# Patient Record
Sex: Male | Born: 1955 | Race: White | Hispanic: No | Marital: Married | Smoking: Never smoker
Health system: Southern US, Community
[De-identification: ages and names within clinical notes are randomized; demographics above are authoritative.]

## PROBLEM LIST (undated history)

## (undated) DIAGNOSIS — K429 Umbilical hernia without obstruction or gangrene: Secondary | ICD-10-CM

## (undated) DIAGNOSIS — J189 Pneumonia, unspecified organism: Secondary | ICD-10-CM

## (undated) DIAGNOSIS — N400 Enlarged prostate without lower urinary tract symptoms: Secondary | ICD-10-CM

## (undated) DIAGNOSIS — C801 Malignant (primary) neoplasm, unspecified: Secondary | ICD-10-CM

## (undated) DIAGNOSIS — E781 Pure hyperglyceridemia: Secondary | ICD-10-CM

## (undated) DIAGNOSIS — J40 Bronchitis, not specified as acute or chronic: Secondary | ICD-10-CM

## (undated) DIAGNOSIS — I10 Essential (primary) hypertension: Secondary | ICD-10-CM

## (undated) HISTORY — DX: Umbilical hernia without obstruction or gangrene: K42.9

## (undated) HISTORY — DX: Essential (primary) hypertension: I10

---

## 2011-05-24 ENCOUNTER — Ambulatory Visit: Payer: Self-pay

## 2011-06-11 ENCOUNTER — Ambulatory Visit: Payer: Self-pay

## 2012-04-09 HISTORY — PX: HERNIA REPAIR: SHX51

## 2012-11-10 ENCOUNTER — Encounter: Payer: Self-pay | Admitting: *Deleted

## 2012-11-27 ENCOUNTER — Ambulatory Visit (INDEPENDENT_AMBULATORY_CARE_PROVIDER_SITE_OTHER): Payer: 59 | Admitting: General Surgery

## 2012-11-27 ENCOUNTER — Encounter: Payer: Self-pay | Admitting: General Surgery

## 2012-11-27 VITALS — BP 130/72 | HR 74 | Resp 12 | Ht 74.0 in | Wt 225.0 lb

## 2012-11-27 DIAGNOSIS — K429 Umbilical hernia without obstruction or gangrene: Secondary | ICD-10-CM

## 2012-11-27 NOTE — Progress Notes (Signed)
Patient ID: Bruce Hester, male   DOB: [birth year], 57 y.o.   MRN: [id number]  Chief Complaint  Patient presents with  . Other    umbilical hernia    HPI [name] is a 57 y.o. male here today for an evaluation of an umbilical hernia. Patient noticed that the hernia had been there for years but one month ago he was doing some lifting (wood) and that caused it to hurt and enlarge. Denies any nausea, vomiting or constipation. HPI  Past Medical History  Diagnosis Date  . Hypertension     History reviewed. No pertinent past surgical history.  History reviewed. No pertinent family history.  Social History History  Substance Use Topics  . Smoking status: Never Smoker   . Smokeless tobacco: Never Used  . Alcohol Use: No    Allergies  Allergen Reactions  . Ibuprofen Rash    Current Outpatient Prescriptions  Medication Sig Dispense Refill  . metoprolol succinate (TOPROL-XL) 25 MG 24 hr tablet Take 0.5 tablets by mouth daily.        No current facility-administered medications for this visit.    Review of Systems Review of Systems  Constitutional: Negative.   Respiratory: Negative.   Cardiovascular: Negative.   Gastrointestinal: Negative.     Blood pressure 130/72, pulse 74, resp. rate 12, height 6\' 2"  (1.88 m), weight 225 lb (102.059 kg).  Physical Exam Physical Exam  Constitutional: He is oriented to person, place, and time. He appears well-developed and well-nourished.  Cardiovascular: Normal rate and regular rhythm.   Pulmonary/Chest: Effort normal and breath sounds normal.  Abdominal: Soft. Bowel sounds are normal. There is no tenderness.  Lymphadenopathy:    He has no cervical adenopathy.  Neurological: He is alert and oriented to person, place, and time.  Skin: Skin is warm and dry.  2 cm defect at umbilicus area   Data Reviewed None.  Assessment    Symptomatic umbilical hernia.     Plan    Indications for elective repair were reviewed. The  possibility of prosthetic mesh was discussed.     Patient's surgery has been scheduled for 12-04-12 at [facility].   Earline Mayotte 11/28/2012, 7:45 PM

## 2012-11-27 NOTE — Patient Instructions (Addendum)
Outpatient surgery Umbilical Hernia precautions and incarceration were discussed with the patient. If they develop symptoms of an incarcerated hernia, they were encouraged to seek prompt medical attention.  I have recommended repair of the hernia using mesh on an outpatient basis in the near future. The risk of infection was reviewed. The role of prosthetic mesh to minimize the risk of recurrence was reviewed.  Hernia, Surgical Repair A hernia occurs when an internal organ pushes out through a weak spot in the belly (abdominal) wall muscles. Hernias commonly occur in the groin and around the navel. Hernias often can be pushed back into place (reduced). Most hernias tend to get worse over time. Problems occur when abdominal contents get stuck in the opening (incarcerated hernia). The blood supply gets cut off (strangulated hernia). This is an emergency and needs surgery. Otherwise, hernia repair can be an elective procedure. This means you can schedule this at your convenience when an emergency is not present. Because complications can occur, if you decide to repair the hernia, it is best to do it soon. When it becomes an emergency procedure, there is increased risk of complications after surgery. CAUSES   Heavy lifting.  Obesity.  Prolonged coughing.  Straining to move your bowels.  Hernias can also occur through a cut (incision) by a surgeonafter an abdominal operation. HOME CARE INSTRUCTIONS Before the repair:  Bed rest is not required. You may continue your normal activities, but avoid heavy lifting (more than 10 pounds) or straining. Cough gently. If you are a smoker, it is best to stop. Even the best hernia repair can break down with the continual strain of coughing.  Do not wear anything tight over your hernia. Do not try to keep it in with an outside bandage or truss. These can damage abdominal contents if they are trapped in the hernia sac.  Eat a normal diet. Avoid constipation.  Straining over long periods of time to have a bowel movement will increase hernia size. It also can breakdown repairs. If you cannot do this with diet alone, laxatives or stool softeners may be used. PRIOR TO SURGERY, SEEK IMMEDIATE MEDICAL CARE IF: You have problems (symptoms) of a trapped (incarcerated) hernia. Symptoms include:  An oral temperature above 102 F (38.9 C) develops, or as your caregiver suggests.  Increasing abdominal pain.  Feeling sick to your stomach(nausea) and vomiting.  You stop passing gas or stool.  The hernia is stuck outside the abdomen, looks discolored, feels hard, or is tender.  You have any changes in your bowel habits or in the hernia that is unusual for you. LET YOUR CAREGIVERS KNOW ABOUT THE FOLLOWING:  Allergies.  Medications taken including herbs, eye drops, over the counter medications, and creams.  Use of steroids (by mouth or creams).  Family or personal history of problems with anesthetics or Novocaine.  Possibility of pregnancy, if this applies.  Personal history of blood clots (thrombophlebitis).  Family or personal history of bleeding or blood problems.  Previous surgery.  Other health problems. BEFORE THE PROCEDURE You should be present 1 hour prior to your procedure, or as directed by your caregiver.  AFTER THE PROCEDURE After surgery, you will be taken to the recovery area. A nurse will watch and check your progress there. Once you are awake, stable, and taking fluids well, you will be allowed to go home as long as there are no problems. Once home, an ice pack (wrapped in a light towel) applied to your operative site may help  with discomfort. It may also keep the swelling down. Do not lift anything heavier than 10 pounds (4.55 kilograms). Take showers not baths. Do not drive while taking narcotics. Follow instructions as suggested by your caregiver.  SEEK IMMEDIATE MEDICAL CARE IF: After surgery:  There is redness, swelling, or  increasing pain in the wound.  There is pus coming from the wound.  There is drainage from a wound lasting longer than 1 day.  An unexplained oral temperature above 102 F (38.9 C) develops.  You notice a foul smell coming from the wound or dressing.  There is a breaking open of a wound (edged not staying together) after the sutures have been removed.  You notice increasing pain in the shoulders (shoulder strap areas).  You develop dizzy episodes or fainting while standing.  You develop persistent nausea or vomiting.  You develop a rash.  You have difficulty breathing.  You develop any reaction or side effects to medications given. MAKE SURE YOU:   Understand these instructions.  Will watch your condition.  Will get help right away if you are not doing well or get worse. Document Released: 09/19/2000 Document Revised: 06/18/2011 Document Reviewed: 08/12/2007 [facility] Patient Information 2014 [place], [place].  Patient's surgery has been scheduled for 12-04-12 at [facility].

## 2012-11-28 ENCOUNTER — Encounter: Payer: Self-pay | Admitting: General Surgery

## 2012-11-28 ENCOUNTER — Other Ambulatory Visit: Payer: Self-pay | Admitting: General Surgery

## 2012-11-28 DIAGNOSIS — K429 Umbilical hernia without obstruction or gangrene: Secondary | ICD-10-CM

## 2012-12-02 ENCOUNTER — Ambulatory Visit: Payer: Self-pay | Admitting: Anesthesiology

## 2012-12-02 DIAGNOSIS — I10 Essential (primary) hypertension: Secondary | ICD-10-CM

## 2012-12-04 ENCOUNTER — Ambulatory Visit: Payer: Self-pay | Admitting: General Surgery

## 2012-12-04 DIAGNOSIS — K429 Umbilical hernia without obstruction or gangrene: Secondary | ICD-10-CM

## 2012-12-05 ENCOUNTER — Encounter: Payer: Self-pay | Admitting: General Surgery

## 2012-12-15 ENCOUNTER — Ambulatory Visit (INDEPENDENT_AMBULATORY_CARE_PROVIDER_SITE_OTHER): Payer: 59 | Admitting: General Surgery

## 2012-12-15 ENCOUNTER — Encounter: Payer: Self-pay | Admitting: General Surgery

## 2012-12-15 VITALS — BP 138/82 | HR 80 | Resp 14 | Ht 74.0 in | Wt 223.0 lb

## 2012-12-15 DIAGNOSIS — K429 Umbilical hernia without obstruction or gangrene: Secondary | ICD-10-CM

## 2012-12-15 NOTE — Patient Instructions (Addendum)
Patient to return as needed. 

## 2012-12-15 NOTE — Progress Notes (Signed)
Patient ID: Bruce Hester, male   DOB: 1955-07-27, 57 y.o.   MRN: [id number]  Chief Complaint  Patient presents with  . Routine Post Op    umbilical hernia     HPI Bruce Hester is a 57 y.o. male here today for his post op umbilical hernia repair done  The procedure was performed on 12/04/12. Patient states he is doing well. No complaints at this time.   HPI  Past Medical History  Diagnosis Date  . Hypertension     Past Surgical History  Procedure Laterality Date  . Hernia repair  2014    umbilical    No family history on file.  Social History History  Substance Use Topics  . Smoking status: Never Smoker   . Smokeless tobacco: Never Used  . Alcohol Use: No    Allergies  Allergen Reactions  . Ibuprofen Rash    Current Outpatient Prescriptions  Medication Sig Dispense Refill  . metoprolol succinate (TOPROL-XL) 25 MG 24 hr tablet Take 0.5 tablets by mouth daily.        No current facility-administered medications for this visit.    Review of Systems Review of Systems  Constitutional: Negative.   Respiratory: Negative.   Cardiovascular: Negative.   Gastrointestinal: Negative.     Blood pressure 138/82, pulse 80, resp. rate 14, height 6\' 2"  (1.88 m), weight 223 lb (101.152 kg).  Physical Exam Physical Exam  Constitutional: He is oriented to person, place, and time. He appears well-developed and well-nourished.  Abdominal: Soft. Normal appearance and bowel sounds are normal. There is no hepatosplenomegaly. There is no tenderness. No hernia.  Neurological: He is alert and oriented to person, place, and time.  Skin: Skin is warm and dry.    Data Reviewed None.  Assessment    Doing well status post umbilical hernia repair.    Plan    Care lifting was reviewed. Will have him return to work with a 20 pound weight limit for the next 2 weeks.       Earline Mayotte 12/15/2012, 9:12 PM

## 2013-01-07 ENCOUNTER — Telehealth: Payer: Self-pay

## 2013-01-07 NOTE — Telephone Encounter (Signed)
Work note sent at patient request.

## 2013-04-09 DIAGNOSIS — J189 Pneumonia, unspecified organism: Secondary | ICD-10-CM

## 2013-04-09 HISTORY — DX: Pneumonia, unspecified organism: J18.9

## 2013-04-09 HISTORY — PX: COLONOSCOPY: SHX174

## 2013-05-27 ENCOUNTER — Ambulatory Visit: Payer: Self-pay | Admitting: Family Medicine

## 2013-10-08 DIAGNOSIS — E781 Pure hyperglyceridemia: Secondary | ICD-10-CM | POA: Insufficient documentation

## 2013-12-10 DIAGNOSIS — R739 Hyperglycemia, unspecified: Secondary | ICD-10-CM | POA: Insufficient documentation

## 2014-01-18 ENCOUNTER — Ambulatory Visit: Payer: Self-pay | Admitting: Gastroenterology

## 2014-01-20 LAB — PATHOLOGY REPORT

## 2014-03-21 ENCOUNTER — Ambulatory Visit: Payer: Self-pay

## 2014-07-30 NOTE — Op Note (Signed)
PATIENT NAME:  JOHNMARK, GEIGER MR#:  [id number] DATE OF BIRTH:  11-Oct-1955  DATE OF PROCEDURE:  12/04/2012  PREOPERATIVE DIAGNOSIS:  Umbilical hernia.   POSTOPERATIVE DIAGNOSIS:  Umbilical hernia.  OPERATIVE PROCEDURE:  Umbilical hernia repair.   SURGEON:  Hervey Ard, M.D.   ANESTHESIA:  General by LMA, Marcaine 0.5% plain, 30 mL local infiltration.   ESTIMATED BLOOD LOSS:  Minimal.   CLINICAL NOTE:  This 59 year old male has developed a symptomatic umbilical hernia.  He was admitted for elective repair.  He received Kefzol prior to the procedure.  Hair was removed with clippers.   OPERATIVE NOTE:  With the patient comfortably supine on the operating table, the area was prepped with ChloraPrep and draped.  Marcaine was infiltrated for postoperative analgesia.  An infraumbilical incision was made sharply and carried down through the skin and subcutaneous tissue with hemostasis achieved with electrocautery.  The umbilical skin was elevated off the umbilical hernia sac which was excised and discarded.  The inferior surface of the fascia was clear.  No additional defects were noted.  The fascial defect was noted to be 2 cm in diameter.  A primary repair was undertaken.  Interrupted 0 Surgilon sutures were used with easy approximation of the fascia.  The umbilical skin was tacked to the fascia with 3-0 Vicryl suture.  The adipose layer was approximated with a running 3-0 Vicryl.  The skin was closed with a running 4-0 Vicryl subcuticular suture.  Benzoin, Steri-Strips, Telfa and Tegaderm dressing was then applied.    The patient tolerated the procedure well and was taken to the recovery room in stable condition.      ____________________________ Robert Bellow, MD jwb:ea D: 12/04/2012 18:23:28 ET T: 12/05/2012 03:17:10 ET JOB#: [id number]  cc: Robert Bellow, MD, <Dictator> Leonie Douglas. Doy Hutching, MD Robert Bellow, MD Truitt Cruey Amedeo Kinsman MD ELECTRONICALLY SIGNED 12/05/2012 22:25

## 2015-02-01 ENCOUNTER — Encounter: Payer: Self-pay | Admitting: Emergency Medicine

## 2015-02-01 ENCOUNTER — Ambulatory Visit
Admission: EM | Admit: 2015-02-01 | Discharge: 2015-02-01 | Disposition: A | Discharge status: Home / Self Care | Payer: 59 | Attending: Family Medicine | Admitting: Family Medicine

## 2015-02-01 DIAGNOSIS — J40 Bronchitis, not specified as acute or chronic: Secondary | ICD-10-CM

## 2015-02-01 DIAGNOSIS — R05 Cough: Secondary | ICD-10-CM

## 2015-02-01 DIAGNOSIS — R059 Cough, unspecified: Secondary | ICD-10-CM

## 2015-02-01 MED ORDER — ALBUTEROL SULFATE HFA 108 (90 BASE) MCG/ACT IN AERS: 2.0000 | INHALATION_SPRAY | RESPIRATORY_TRACT | Status: DC | PRN

## 2015-02-01 MED ORDER — HYDROCOD POLST-CPM POLST ER 10-8 MG/5ML PO SUER: 5.0000 mL | Freq: Two times a day (BID) | ORAL | Status: DC | PRN

## 2015-02-01 MED ORDER — AZITHROMYCIN 250 MG PO TABS: ORAL_TABLET | ORAL | Status: DC

## 2015-02-01 NOTE — ED Provider Notes (Signed)
CSN: [id number]     Arrival date & time 02/01/15  1315 History   First MD Initiated Contact with Patient 02/01/15 1349     Nurses notes were reviewed. Chief Complaint  Patient presents with  . Cough   He has been having a cough for a week and nonproductive  (Consider location/radiation/quality/duration/timing/severity/associated sxs/prior Treatment) Patient is a 59 y.o. male presenting with cough. The history is provided by the patient. No language interpreter was used.  Cough Cough characteristics:  Non-productive Severity:  Moderate Associated symptoms: shortness of breath and wheezing   Associated symptoms: no chest pain, no ear pain and no rash     Past Medical History  Diagnosis Date  . Hypertension    Past Surgical History  Procedure Laterality Date  . Hernia repair  1537    umbilical   History reviewed. No pertinent family history. Social History  Substance Use Topics  . Smoking status: Never Smoker   . Smokeless tobacco: Never Used  . Alcohol Use: No    Review of Systems  Constitutional: Negative for activity change and appetite change.  HENT: Negative for ear pain.   Respiratory: Positive for cough, shortness of breath and wheezing.   Cardiovascular: Negative for chest pain, palpitations and leg swelling.  Musculoskeletal: Negative for joint swelling.  Skin: Negative for color change and rash.  All other systems reviewed and are negative.   Allergies  Ibuprofen  Home Medications   Prior to Admission medications   Medication Sig Start Date End Date Taking? Authorizing Provider  albuterol (PROVENTIL HFA;VENTOLIN HFA) 108 (90 BASE) MCG/ACT inhaler Inhale 2 puffs into the lungs every 4 (four) hours as needed for wheezing or shortness of breath. 02/01/15   Frederich Cha, MD  azithromycin (ZITHROMAX Z-PAK) 250 MG tablet Take 2 tablets first day and then 1 po a day for 4 days 02/01/15   Frederich Cha, MD  chlorpheniramine-HYDROcodone [facility])  10-8 MG/5ML SUER Take 5 mLs by mouth every 12 (twelve) hours as needed for cough. 02/01/15   Frederich Cha, MD  metoprolol succinate (TOPROL-XL) 25 MG 24 hr tablet Take 0.5 tablets by mouth daily.  11/20/12   Historical Provider, MD   Meds Ordered and Administered this Visit  Medications - No data to display  BP 156/90 mmHg  Pulse 100  Temp(Src) 98.8 F (37.1 C) (Tympanic)  Resp 16  Ht 6\' 2"  (1.88 m)  Wt 235 lb (106.595 kg)  BMI 30.16 kg/m2  SpO2 96% No data found.   Physical Exam  Constitutional: He is oriented to person, place, and time. He appears well-developed and well-nourished.  HENT:  Head: Normocephalic and atraumatic.  Right Ear: External ear normal.  Left Ear: External ear normal.  Nose: Nose normal.  Eyes: Pupils are equal, round, and reactive to light.  Neck: Normal range of motion. Neck supple. No tracheal deviation present.  Cardiovascular: Normal rate, regular rhythm and normal heart sounds.   Pulmonary/Chest: Effort normal and breath sounds normal. No respiratory distress.  Musculoskeletal: Normal range of motion. He exhibits no edema.  Neurological: He is alert and oriented to person, place, and time. No cranial nerve deficit.  Skin: Skin is warm and dry. He is not diaphoretic.  Psychiatric: He has a normal mood and affect. His behavior is normal.  Vitals reviewed.   ED Course  Procedures (including critical care time)  Labs Review Labs Reviewed - No data to display  Imaging Review No results found.   Visual Acuity Review  Right Eye Distance:   Left Eye Distance:   Bilateral Distance:    Right Eye Near:   Left Eye Near:    Bilateral Near:         MDM   1. Bronchitis   2. Cough    patient's here because of cough for week will place on Z-Pak and Tussionex and albuterol inhaler.  Frederich Cha, MD 02/01/15 1500

## 2015-02-01 NOTE — Discharge Instructions (Signed)
Upper Respiratory Infection, Adult Most upper respiratory infections (URIs) are caused by a virus. A URI affects the nose, throat, and upper air passages. The most common type of URI is often called "the common cold." HOME CARE   Take medicines only as told by your doctor.  Gargle warm saltwater or take cough drops to comfort your throat as told by your doctor.  Use a warm mist humidifier or inhale steam from a shower to increase air moisture. This may make it easier to breathe.  Drink enough fluid to keep your pee (urine) clear or pale yellow.  Eat soups and other clear broths.  Have a healthy diet.  Rest as needed.  Go back to work when your fever is gone or your doctor says it is okay.  You may need to stay home longer to avoid giving your URI to others.  You can also wear a face mask and wash your hands often to prevent spread of the virus.  Use your inhaler more if you have asthma.  Do not use any tobacco products, including cigarettes, chewing tobacco, or electronic cigarettes. If you need help quitting, ask your doctor. GET HELP IF:  You are getting worse, not better.  Your symptoms are not helped by medicine.  You have chills.  You are getting more short of breath.  You have brown or red mucus.  You have yellow or brown discharge from your nose.  You have pain in your face, especially when you bend forward.  You have a fever.  You have puffy (swollen) neck glands.  You have pain while swallowing.  You have white areas in the back of your throat. GET HELP RIGHT AWAY IF:   You have very bad or constant:  Headache.  Ear pain.  Pain in your forehead, behind your eyes, and over your cheekbones (sinus pain).  Chest pain.  You have long-lasting (chronic) lung disease and any of the following:  Wheezing.  Long-lasting cough.  Coughing up blood.  A change in your usual mucus.  You have a stiff neck.  You have changes in  your:  Vision.  Hearing.  Thinking.  Mood. MAKE SURE YOU:   Understand these instructions.  Will watch your condition.  Will get help right away if you are not doing well or get worse.   This information is not intended to replace advice given to you by your health care provider. Make sure you discuss any questions you have with your health care provider.   Document Released: 09/12/2007 Document Revised: 08/10/2014 Document Reviewed: 07/01/2013 Elsevier Interactive Patient Education 2016 [employer]  Cough, Adult A cough helps to clear your throat and lungs. A cough may last only 2-3 weeks (acute), or it may last longer than 8 weeks (chronic). Many different things can cause a cough. A cough may be a sign of an illness or another medical condition. HOME CARE  Pay attention to any changes in your cough.  Take medicines only as told by your doctor.  If you were prescribed an antibiotic medicine, take it as told by your doctor. Do not stop taking it even if you start to feel better.  Talk with your doctor before you try using a cough medicine.  Drink enough fluid to keep your pee (urine) clear or pale yellow.  If the air is dry, use a cold steam vaporizer or humidifier in your home.  Stay away from things that make you cough at work or at home.  If your cough is worse at night, try using extra pillows to raise your head up higher while you sleep.  Do not smoke, and try not to be around smoke. If you need help quitting, ask your doctor.  Do not have caffeine.  Do not drink alcohol.  Rest as needed. GET HELP IF:  You have new problems (symptoms).  You cough up yellow fluid (pus).  Your cough does not get better after 2-3 weeks, or your cough gets worse.  Medicine does not help your cough and you are not sleeping well.  You have pain that gets worse or pain that is not helped with medicine.  You have a fever.  You are losing weight and you do not know  why.  You have night sweats. GET HELP RIGHT AWAY IF:  You cough up blood.  You have trouble breathing.  Your heartbeat is very fast.   This information is not intended to replace advice given to you by your health care provider. Make sure you discuss any questions you have with your health care provider.   Document Released: 12/07/2010 Document Revised: 12/15/2014 Document Reviewed: 06/02/2014 Elsevier Interactive Patient Education [employer].

## 2015-02-01 NOTE — ED Notes (Signed)
Patient c/o cough and chest congestion for 7 days.  Patient denies fevers.

## 2015-03-17 ENCOUNTER — Ambulatory Visit (INDEPENDENT_AMBULATORY_CARE_PROVIDER_SITE_OTHER): Payer: BLUE CROSS/BLUE SHIELD | Admitting: Urology

## 2015-03-17 ENCOUNTER — Encounter: Payer: Self-pay | Admitting: Urology

## 2015-03-17 VITALS — BP 164/101 | HR 86 | Ht 74.0 in | Wt 234.6 lb

## 2015-03-17 DIAGNOSIS — N528 Other male erectile dysfunction: Secondary | ICD-10-CM

## 2015-03-17 DIAGNOSIS — N138 Other obstructive and reflux uropathy: Secondary | ICD-10-CM

## 2015-03-17 DIAGNOSIS — N401 Enlarged prostate with lower urinary tract symptoms: Secondary | ICD-10-CM | POA: Diagnosis not present

## 2015-03-17 DIAGNOSIS — N5089 Other specified disorders of the male genital organs: Secondary | ICD-10-CM

## 2015-03-17 DIAGNOSIS — N529 Male erectile dysfunction, unspecified: Secondary | ICD-10-CM | POA: Insufficient documentation

## 2015-03-17 NOTE — Progress Notes (Signed)
03/17/2015 10:23 AM   Bruce Hester 06-[name]-1956 [id number]  Referring provider: Idelle Crouch, MD [facility] [place], [place] [address]  Chief Complaint  Patient presents with  . Groin Swelling    referred by Dr. Doy Hutching x 1 month (swelling)    HPI: Patient is a 59 year old white male with a one month history of scrotal swelling who is referred by his PCP, Dr. Doy Hutching for further evaluation and management.     Patient states his wife, [name], noticed that his testicles had become 2 months ago.  The patient stated he had noticed. Since his wife has made that observation he has not noticed any change in the scrotum. He is not finding the swelling, comfortable. He is not had trauma or infection to the scrotal area.  He does have a history of an umbilical hernia which was repaired in 2014.    He has baseline urinary symptoms of urgency, nocturia 2, hesitancy and weak urinary stream. He states that these symptoms he can a few months ago. He has not had dysuria, gross hematuria or suprapubic pain.  He states he has his prostate examined with his primary care physician. His last PSA was 0.22 ng/mL on 12/03/2013.  He also had the onset of erectile dysfunction 2 weeks ago. He states that his erection was not firm enough for intercourse. He denied any pain or curvature with erection. He attributed to an increase in his blood pressure medication.  The dysfunction seems to have resolved for now.  He has not had any recent fevers, chills, nausea or vomiting.    PMH: Past Medical History  Diagnosis Date  . Hypertension     Surgical History: Past Surgical History  Procedure Laterality Date  . Hernia repair  123456    umbilical    Home Medications:    Medication List       This list is accurate as of: 03/17/15 10:23 AM.  Always use your most recent med list.               albuterol 108 (90 BASE) MCG/ACT inhaler  Commonly known as:  PROVENTIL  HFA;VENTOLIN HFA  Inhale 2 puffs into the lungs every 4 (four) hours as needed for wheezing or shortness of breath.     aspirin EC 81 MG tablet  Take by mouth.     azithromycin 250 MG tablet  Commonly known as:  ZITHROMAX Z-PAK  Take 2 tablets first day and then 1 po a day for 4 days     chlorpheniramine-HYDROcodone 10-8 MG/5ML Suer  Commonly known as:  TUSSIONEX PENNKINETIC ER  Take 5 mLs by mouth every 12 (twelve) hours as needed for cough.     DHA-EPA-VITAMIN E PO  Take by mouth.     FISH OIL PO  Take by mouth daily.     metoprolol succinate 25 MG 24 hr tablet  Commonly known as:  TOPROL-XL  Take 0.5 tablets by mouth daily.     Na Sulfate-K Sulfate-Mg Sulf Soln  Take as directed for colon prep.        Allergies:  Allergies  Allergen Reactions  . Ibuprofen Rash    High doses    Family History: Family History  Problem Relation Age of Onset  . Kidney disease Neg Hx   . Prostate cancer Neg Hx   . Cancer Neg Hx   . Heart disease Father     Social History:  reports that  he has never smoked. He has never used smokeless tobacco. He reports that he does not drink alcohol or use illicit drugs.  ROS: UROLOGY Frequent Urination?: No Hard to postpone urination?: Yes Burning/pain with urination?: No Get up at night to urinate?: Yes Leakage of urine?: No Urine stream starts and stops?: No Trouble starting stream?: Yes Do you have to strain to urinate?: No Blood in urine?: No Urinary tract infection?: No Sexually transmitted disease?: No Injury to kidneys or bladder?: No Painful intercourse?: No Weak stream?: Yes Erection problems?: Yes Penile pain?: No  Gastrointestinal Nausea?: No Vomiting?: No Indigestion/heartburn?: No Diarrhea?: No Constipation?: No  Constitutional Fever: No Night sweats?: No Weight loss?: No Fatigue?: No  Skin Skin rash/lesions?: No Itching?: No  Eyes Blurred vision?: No Double vision?: No  Ears/Nose/Throat Sore  throat?: No Sinus problems?: No  Hematologic/Lymphatic Swollen glands?: No Easy bruising?: No  Cardiovascular Leg swelling?: No Chest pain?: No  Respiratory Cough?: No Shortness of breath?: No  Endocrine Excessive thirst?: No  Musculoskeletal Back pain?: No Joint pain?: Yes  Neurological Headaches?: No Dizziness?: No  Psychologic Depression?: No Anxiety?: No  Physical Exam: BP 164/101 mmHg  Pulse 86  Ht 6\' 2"  (1.88 m)  Wt 234 lb 9.6 oz (106.414 kg)  BMI 30.11 kg/m2  Constitutional: Well nourished. Alert and oriented, No acute distress. HEENT: [place] AT, moist mucus membranes. Trachea midline, no masses. Cardiovascular: No clubbing, cyanosis, or edema. Respiratory: Normal respiratory effort, no increased work of breathing. GI: Abdomen is soft, non tender, non distended, no abdominal masses. Liver and spleen not palpable.  No hernias appreciated.  Stool sample for occult testing is not indicated.   GU: No CVA tenderness.  No bladder fullness or masses.  Patient with circumcised phallus.  Urethral meatus is patent.  No penile discharge. No penile lesions or rashes. Scrotum without lesions, cysts, rashes and/or edema.  Bilateral hydroceles are noted.  Testicles could not be palpated due to the hydroceles. No masses are appreciated in the testicles. Left and right epididymis are normal. Rectal: Patient with  normal sphincter tone. Anus and perineum without scarring or rashes. No rectal masses are appreciated. Prostate is approximately 50 grams, (could not palpated entire gland due to buttocks tissue). Skin: No rashes, bruises or suspicious lesions. Lymph: No cervical or inguinal adenopathy. Neurologic: Grossly intact, no focal deficits, moving all 4 extremities. Psychiatric: Normal mood and affect.   Assessment & Plan:    1. Scrotal swelling:   Patient has bilateral hydroceles, but the testicles could not be palpated.  I will obtain a scrotal ultrasound to assess the  testicles and the other contents of the scrotum.  He will return for his report and discussion concerning surgery of the hydroceles.    2. BPH with LUTS:   Patient is having mild obstructive symptoms at this time.  We will readdress once the scrotal swelling work up is complete.  - PSA  3. Erectile dysfunction:  Patient had a the recent onset of ED 2 weeks ago which he attributes to his BP medication.  It has seemed to resolved for the time being.  We will continue to monitor.    Return in about 1 week (around 03/24/2015) for scrotal ultrasound report.  Zara Council, [facility] [address], [place], [place] [address] [phone]

## 2015-03-18 ENCOUNTER — Telehealth: Payer: Self-pay

## 2015-03-18 LAB — PSA: Prostate Specific Ag, Serum: 0.2 ng/mL (ref 0.0–4.0)

## 2015-03-18 NOTE — Telephone Encounter (Signed)
-----   Message from Nori Riis, PA-C sent at 03/18/2015  8:34 AM EST ----- PSA is within normal limits.  I suggest repeated it again in one year.

## 2015-03-18 NOTE — Telephone Encounter (Signed)
LMOM

## 2015-03-21 ENCOUNTER — Ambulatory Visit
Admission: RE | Admit: 2015-03-21 | Discharge: 2015-03-21 | Disposition: A | Discharge status: Home / Self Care | Payer: BLUE CROSS/BLUE SHIELD | Source: Ambulatory Visit | Attending: Urology | Admitting: Urology

## 2015-03-21 DIAGNOSIS — N5089 Other specified disorders of the male genital organs: Secondary | ICD-10-CM | POA: Diagnosis present

## 2015-03-21 DIAGNOSIS — N433 Hydrocele, unspecified: Secondary | ICD-10-CM | POA: Diagnosis not present

## 2015-03-21 NOTE — Telephone Encounter (Signed)
No answer

## 2015-03-23 NOTE — Telephone Encounter (Signed)
LMOM- will send a letter.  

## 2015-03-24 ENCOUNTER — Encounter: Payer: Self-pay | Admitting: Urology

## 2015-03-24 ENCOUNTER — Ambulatory Visit (INDEPENDENT_AMBULATORY_CARE_PROVIDER_SITE_OTHER): Payer: BLUE CROSS/BLUE SHIELD | Admitting: Urology

## 2015-03-24 VITALS — BP 158/99 | HR 65 | Ht 76.0 in | Wt 232.4 lb

## 2015-03-24 DIAGNOSIS — N528 Other male erectile dysfunction: Secondary | ICD-10-CM | POA: Diagnosis not present

## 2015-03-24 DIAGNOSIS — N401 Enlarged prostate with lower urinary tract symptoms: Secondary | ICD-10-CM

## 2015-03-24 DIAGNOSIS — N529 Male erectile dysfunction, unspecified: Secondary | ICD-10-CM

## 2015-03-24 DIAGNOSIS — N433 Hydrocele, unspecified: Secondary | ICD-10-CM

## 2015-03-24 DIAGNOSIS — N138 Other obstructive and reflux uropathy: Secondary | ICD-10-CM

## 2015-03-24 LAB — MICROSCOPIC EXAMINATION: Bacteria, UA: NONE SEEN

## 2015-03-24 LAB — URINALYSIS, COMPLETE
Bilirubin, UA: NEGATIVE
GLUCOSE, UA: NEGATIVE
KETONES UA: NEGATIVE
Leukocytes, UA: NEGATIVE
NITRITE UA: NEGATIVE
Protein, UA: NEGATIVE
Urobilinogen, Ur: 0.2 mg/dL (ref 0.2–1.0)
pH, UA: 6 (ref 5.0–7.5)

## 2015-03-24 NOTE — Progress Notes (Addendum)
10:02 AM   Bruce Hester 08/11/1955 [id number]  Referring provider: Idelle Crouch, MD [facility] [place], [place] [address]  Chief Complaint  Patient presents with  . Results    [place] srotal    HPI: Patient is a 59 year old white male who presents for his scrotal ultrasound report.  Background story Patient states his wife, Bruce Hester, noticed that his testicles had become 2 months ago.  The patient stated he had noticed. Since his wife has made that observation he has not noticed any change in the scrotum. He is not finding the swelling, comfortable. He is not had trauma or infection to the scrotal area.  He does have a history of an umbilical hernia which was repaired in 2014.    He has baseline urinary symptoms of urgency, nocturia 2, hesitancy and weak urinary stream. He states that these symptoms he can a few months ago. He has not had dysuria, gross hematuria or suprapubic pain.  He states he has his prostate examined with his primary care physician. His last PSA was 0.2 ng/mL on 03/17/2015.  He also had the onset of erectile dysfunction 2 weeks ago. He states that his erection was not firm enough for intercourse. He denied any pain or curvature with erection. He attributed to an increase in his blood pressure medication.  The dysfunction seems to have resolved for now.  Today, he states he has not had any changes with his scrotum.  He has not had any recent fevers, chills, nausea or vomiting.  I have reviewed the images with the patient and his wife.    PMH: Past Medical History  Diagnosis Date  . Hypertension   . Umbilical hernia     Surgical History: Past Surgical History  Procedure Laterality Date  . Hernia repair  123456    umbilical    Home Medications:    Medication List       This list is accurate as of: 03/24/15 10:02 AM.  Always use your most recent med list.               albuterol 108 (90 BASE) MCG/ACT inhaler    Commonly known as:  PROVENTIL HFA;VENTOLIN HFA  Inhale 2 puffs into the lungs every 4 (four) hours as needed for wheezing or shortness of breath.     aspirin EC 81 MG tablet  Take by mouth.     azithromycin 250 MG tablet  Commonly known as:  ZITHROMAX Z-PAK  Take 2 tablets first day and then 1 po a day for 4 days     chlorpheniramine-HYDROcodone 10-8 MG/5ML Suer  Commonly known as:  TUSSIONEX PENNKINETIC ER  Take 5 mLs by mouth every 12 (twelve) hours as needed for cough.     DHA-EPA-VITAMIN E PO  Take by mouth.     FISH OIL PO  Take by mouth daily.     metoprolol succinate 25 MG 24 hr tablet  Commonly known as:  TOPROL-XL  Take 0.5 tablets by mouth daily.     Na Sulfate-K Sulfate-Mg Sulf Soln  Reported on 03/24/2015        Allergies:  Allergies  Allergen Reactions  . Ibuprofen Rash    High doses    Family History: Family History  Problem Relation Age of Onset  . Kidney disease Neg Hx   . Prostate cancer Neg Hx   . Cancer Neg Hx   . Heart disease Father  Social History:  reports that he has never smoked. He has never used smokeless tobacco. He reports that he does not drink alcohol or use illicit drugs.  ROS: UROLOGY Frequent Urination?: No Hard to postpone urination?: Yes Burning/pain with urination?: No Get up at night to urinate?: No Leakage of urine?: No Urine stream starts and stops?: No Trouble starting stream?: No Do you have to strain to urinate?: No Blood in urine?: No Urinary tract infection?: No Sexually transmitted disease?: No Injury to kidneys or bladder?: No Painful intercourse?: No Weak stream?: Yes Erection problems?: No Penile pain?: No  Gastrointestinal Nausea?: No Vomiting?: No Indigestion/heartburn?: No Diarrhea?: No Constipation?: No  Constitutional Fever: No Night sweats?: No Weight loss?: No Fatigue?: No  Skin Skin rash/lesions?: No Itching?: No  Eyes Blurred vision?: Yes Double vision?:  No  Ears/Nose/Throat Sore throat?: No Sinus problems?: No  Hematologic/Lymphatic Swollen glands?: No Easy bruising?: No  Cardiovascular Leg swelling?: No Chest pain?: No  Respiratory Cough?: No Shortness of breath?: No  Endocrine Excessive thirst?: No  Musculoskeletal Back pain?: No Joint pain?: Yes  Neurological Headaches?: No Dizziness?: No  Psychologic Depression?: No Anxiety?: No  Physical Exam: BP 158/99 mmHg  Pulse 65  Ht 6\' 4"  (1.93 m)  Wt 232 lb 6.4 oz (105.416 kg)  BMI 28.30 kg/m2  Constitutional: Well nourished. Alert and oriented, No acute distress. HEENT: [place] AT, moist mucus membranes. Trachea midline, no masses. Cardiovascular: No clubbing, cyanosis, or edema. Respiratory: Normal respiratory effort, no increased work of breathing. GI: Abdomen is soft, non tender, non distended, no abdominal masses. Liver and spleen not palpable.  No hernias appreciated.  Stool sample for occult testing is not indicated.   GU: No CVA tenderness.  No bladder fullness or masses.  Patient with circumcised phallus.  Urethral meatus is patent.  No penile discharge. No penile lesions or rashes. Scrotum without lesions, cysts, rashes and/or edema.  Bilateral hydroceles are noted.  Testicles could not be palpated due to the hydroceles. No masses are appreciated in the testicles. Left and right epididymis are normal. Rectal: Patient with  normal sphincter tone. Anus and perineum without scarring or rashes. No rectal masses are appreciated. Prostate is approximately 50 grams, (could not palpated entire gland due to buttocks tissue). Skin: No rashes, bruises or suspicious lesions. Lymph: No cervical or inguinal adenopathy. Neurologic: Grossly intact, no focal deficits, moving all 4 extremities. Psychiatric: Normal mood and affect.  Pertinent Imaging CLINICAL DATA: Bilateral scrotal swelling for 2 months  EXAM: SCROTAL ULTRASOUND  DOPPLER ULTRASOUND OF THE  TESTICLES  TECHNIQUE: Complete ultrasound examination of the testicles, epididymis, and other scrotal structures was performed. Color and spectral Doppler ultrasound were also utilized to evaluate blood flow to the testicles.  COMPARISON: None.  FINDINGS: Right testicle  Measurements: 4.4 x 2.6 x 3.5 cm. No mass or microlithiasis visualized.  Left testicle  Measurements: 4.6 x 2.2 x 3.1 cm. No mass or microlithiasis visualized.  Right epididymis: Normal in size and appearance.  Left epididymis: Normal in size and appearance.  Hydrocele: Moderate-sized right hydrocele.  Varicocele: None visualized.  Pulsed Doppler interrogation of both testes demonstrates normal low resistance arterial and venous waveforms bilaterally.  IMPRESSION: 1. No testicular torsion. No testicular mass. 2. Right hydrocele.   Electronically Signed  By: Kathreen Devoid  On: 03/21/2015 10:51  Assessment & Plan:    1. Scrotal swelling:   The scrotal swelling is a right hydrocele.  I explained to the patient how the hydrocelectomy is performed and  the risk factors, such as: injury to spermatic vessels can occur, affecting fertility,  bleeding from the surgical incision, internal bleeding, infection and swelling of the scrotum for up to a month.  I also informed him of the recovery period of being able to resume most activities within seven to 10 days, although heavy lifting and sexual activities may be delayed for up to six weeks.I also explained the risks of general anesthesia, such as: MI, CVA, paralysis, coma and/or death.   He would like to pursue a hydrocelectomy, but he will need to talk with his employers prior to scheduling.  -urinalysis -urine culture  2. BPH with LUTS:   Patient is having mild obstructive symptoms at this time.  His PSA has returned at 0.2 ng/mL on 03/17/2015.  I have suggested that he have annual prostate exams and PSA's.    3. Erectile dysfunction:   Patient had a the recent onset of ED 2 weeks ago which he attributes to his BP medication.  It has seemed to resolved for the time being.  We will continue to monitor.    Return for patient to call to schedule hydrocelectomy.  Zara Council, [facility] [address], [place], [place] [address] [phone]

## 2015-03-25 LAB — CBC WITH DIFFERENTIAL/PLATELET
BASOS: 1 %
Basophils Absolute: 0 10*3/uL (ref 0.0–0.2)
EOS (ABSOLUTE): 0.3 10*3/uL (ref 0.0–0.4)
Eos: 5 %
HEMOGLOBIN: 15.2 g/dL (ref 12.6–17.7)
Hematocrit: 44.6 % (ref 37.5–51.0)
IMMATURE GRANS (ABS): 0 10*3/uL (ref 0.0–0.1)
Immature Granulocytes: 0 %
LYMPHS ABS: 2.4 10*3/uL (ref 0.7–3.1)
LYMPHS: 37 %
MCH: 30.5 pg (ref 26.6–33.0)
MCHC: 34.1 g/dL (ref 31.5–35.7)
MCV: 89 fL (ref 79–97)
MONOCYTES: 6 %
Monocytes Absolute: 0.4 10*3/uL (ref 0.1–0.9)
NEUTROS ABS: 3.3 10*3/uL (ref 1.4–7.0)
Neutrophils: 51 %
PLATELETS: 222 10*3/uL (ref 150–379)
RBC: 4.99 x10E6/uL (ref 4.14–5.80)
RDW: 13.6 % (ref 12.3–15.4)
WBC: 6.4 10*3/uL (ref 3.4–10.8)

## 2015-03-25 LAB — BASIC METABOLIC PANEL
BUN/Creatinine Ratio: 12 (ref 9–20)
BUN: 12 mg/dL (ref 6–24)
CALCIUM: 9.2 mg/dL (ref 8.7–10.2)
CHLORIDE: 100 mmol/L (ref 96–106)
CO2: 25 mmol/L (ref 18–29)
Creatinine, Ser: 0.99 mg/dL (ref 0.76–1.27)
GFR calc Af Amer: 97 mL/min/{1.73_m2} (ref >59)
GFR calc non Af Amer: 84 mL/min/{1.73_m2} (ref >59)
GLUCOSE: 177 mg/dL — AB (ref 65–99)
Potassium: 4.2 mmol/L (ref 3.5–5.2)
Sodium: 140 mmol/L (ref 134–144)

## 2015-03-26 LAB — CULTURE, URINE COMPREHENSIVE

## 2015-03-27 ENCOUNTER — Ambulatory Visit
Admission: EM | Admit: 2015-03-27 | Discharge: 2015-03-27 | Disposition: A | Discharge status: Home / Self Care | Payer: BLUE CROSS/BLUE SHIELD | Attending: Family Medicine | Admitting: Family Medicine

## 2015-03-27 ENCOUNTER — Encounter: Payer: Self-pay | Admitting: Gynecology

## 2015-03-27 ENCOUNTER — Ambulatory Visit (INDEPENDENT_AMBULATORY_CARE_PROVIDER_SITE_OTHER): Payer: BLUE CROSS/BLUE SHIELD

## 2015-03-27 DIAGNOSIS — J40 Bronchitis, not specified as acute or chronic: Secondary | ICD-10-CM

## 2015-03-27 MED ORDER — ALBUTEROL SULFATE HFA 108 (90 BASE) MCG/ACT IN AERS: 2.0000 | INHALATION_SPRAY | RESPIRATORY_TRACT | Status: DC | PRN

## 2015-03-27 MED ORDER — HYDROCOD POLST-CPM POLST ER 10-8 MG/5ML PO SUER: 5.0000 mL | Freq: Two times a day (BID) | ORAL | Status: DC | PRN

## 2015-03-27 NOTE — Discharge Instructions (Signed)
Acute Bronchitis °Bronchitis is inflammation of the airways that extend from the windpipe into the lungs (bronchi). The inflammation often causes mucus to develop. This leads to a cough, which is the most common symptom of bronchitis.  °In acute bronchitis, the condition usually develops suddenly and goes away over time, usually in a couple weeks. Smoking, allergies, and asthma can make bronchitis worse. Repeated episodes of bronchitis may cause further lung problems.  °CAUSES °Acute bronchitis is most often caused by the same virus that causes a cold. The virus can spread from person to person (contagious) through coughing, sneezing, and touching contaminated objects. °SIGNS AND SYMPTOMS  °· Cough.   °· Fever.   °· Coughing up mucus.   °· Body aches.   °· Chest congestion.   °· Chills.   °· Shortness of breath.   °· Sore throat.   °DIAGNOSIS  °Acute bronchitis is usually diagnosed through a physical exam. Your health care provider will also ask you questions about your medical history. Tests, such as chest X-rays, are sometimes done to rule out other conditions.  °TREATMENT  °Acute bronchitis usually goes away in a couple weeks. Oftentimes, no medical treatment is necessary. Medicines are sometimes given for relief of fever or cough. Antibiotic medicines are usually not needed but may be prescribed in certain situations. In some cases, an inhaler may be recommended to help reduce shortness of breath and control the cough. A cool mist vaporizer may also be used to help thin bronchial secretions and make it easier to clear the chest.  °HOME CARE INSTRUCTIONS °· Get plenty of rest.   °· Drink enough fluids to keep your urine clear or pale yellow (unless you have a medical condition that requires fluid restriction). Increasing fluids may help thin your respiratory secretions (sputum) and reduce chest congestion, and it will prevent dehydration.   °· Take medicines only as directed by your health care provider. °· If  you were prescribed an antibiotic medicine, finish it all even if you start to feel better. °· Avoid smoking and secondhand smoke. Exposure to cigarette smoke or irritating chemicals will make bronchitis worse. If you are a smoker, consider using nicotine gum or skin patches to help control withdrawal symptoms. Quitting smoking will help your lungs heal faster.   °· Reduce the chances of another bout of acute bronchitis by washing your hands frequently, avoiding people with cold symptoms, and trying not to touch your hands to your mouth, nose, or eyes.   °· Keep all follow-up visits as directed by your health care provider.   °SEEK MEDICAL CARE IF: °Your symptoms do not improve after 1 week of treatment.  °SEEK IMMEDIATE MEDICAL CARE IF: °· You develop an increased fever or chills.   °· You have chest pain.   °· You have severe shortness of breath. °· You have bloody sputum.   °· You develop dehydration. °· You faint or repeatedly feel like you are going to pass out. °· You develop repeated vomiting. °· You develop a severe headache. °MAKE SURE YOU:  °· Understand these instructions. °· Will watch your condition. °· Will get help right away if you are not doing well or get worse. °  °This information is not intended to replace advice given to you by your health care provider. Make sure you discuss any questions you have with your health care provider. °  °Document Released: 05/03/2004 Document Revised: 04/16/2014 Document Reviewed: 09/16/2012 °Elsevier Interactive Patient Education ©2016 [employer] ° °Cough, Adult °Coughing is a reflex that clears your throat and your airways. Coughing helps to heal and   protect your lungs. It is normal to cough occasionally, but a cough that happens with other symptoms or lasts a long time may be a sign of a condition that needs treatment. A cough may last only 2-3 weeks (acute), or it may last longer than 8 weeks (chronic). °CAUSES °Coughing is commonly caused by: °· Breathing  in substances that irritate your lungs. °· A viral or bacterial respiratory infection. °· Allergies. °· Asthma. °· Postnasal drip. °· Smoking. °· Acid backing up from the stomach into the esophagus (gastroesophageal reflux). °· Certain medicines. °· Chronic lung problems, including COPD (or rarely, lung cancer). °· Other medical conditions such as heart failure. °HOME CARE INSTRUCTIONS  °Pay attention to any changes in your symptoms. Take these actions to help with your discomfort: °· Take medicines only as told by your health care provider. °¨ If you were prescribed an antibiotic medicine, take it as told by your health care provider. Do not stop taking the antibiotic even if you start to feel better. °¨ Talk with your health care provider before you take a cough suppressant medicine. °· Drink enough fluid to keep your urine clear or pale yellow. °· If the air is dry, use a cold steam vaporizer or humidifier in your bedroom or your home to help loosen secretions. °· Avoid anything that causes you to cough at work or at home. °· If your cough is worse at night, try sleeping in a semi-upright position. °· Avoid cigarette smoke. If you smoke, quit smoking. If you need help quitting, ask your health care provider. °· Avoid caffeine. °· Avoid alcohol. °· Rest as needed. °SEEK MEDICAL CARE IF:  °· You have new symptoms. °· You cough up pus. °· Your cough does not get better after 2-3 weeks, or your cough gets worse. °· You cannot control your cough with suppressant medicines and you are losing sleep. °· You develop pain that is getting worse or pain that is not controlled with pain medicines. °· You have a fever. °· You have unexplained weight loss. °· You have night sweats. °SEEK IMMEDIATE MEDICAL CARE IF: °· You cough up blood. °· You have difficulty breathing. °· Your heartbeat is very fast. °  °This information is not intended to replace advice given to you by your health care provider. Make sure you discuss any  questions you have with your health care provider. °  °Document Released: 09/22/2010 Document Revised: 12/15/2014 Document Reviewed: 06/02/2014 °Elsevier Interactive Patient Education ©2016 [employer] ° °

## 2015-03-27 NOTE — ED Provider Notes (Signed)
CSN: [id number]     Arrival date & time 03/27/15  0840 History   First MD Initiated Contact with Patient 03/27/15 [phone]     Chief Complaint  Patient presents with  . Cough   HPI   Bruce Hester is a pleasant 59 y.o. male who presents with productive cough for the past week. He was seen by Dr. Alveta Heimlich back in October and was treated with Z-Pak, albuterol and to send for bronchitis. He states he remained sick for about a week but symptoms seem to get better. Over the past week he has noticed rattling in his chest when he is supine. He has had mostly clear sputum with chronic coughing. He denies any shortness of breath. O2 sat was 98% on room air. He states his cough is worse at night. He denies any fever. He denies any lower extremity edema. He denies any new medications. He denies any chest pain or palpitations. He has not tried any new medications. He denies any diaphoresis or chills. He is a nonsmoker.  Past Medical History  Diagnosis Date  . Hypertension   . Umbilical hernia    Past Surgical History  Procedure Laterality Date  . Hernia repair  123456    umbilical   Family History  Problem Relation Age of Onset  . Kidney disease Neg Hx   . Prostate cancer Neg Hx   . Cancer Neg Hx   . Heart disease Father    Social History  Substance Use Topics  . Smoking status: Never Smoker   . Smokeless tobacco: Never Used  . Alcohol Use: No    Review of Systems  Constitutional: Positive for activity change. Negative for fever, chills, diaphoresis and unexpected weight change.  HENT: Positive for congestion.   Eyes: Negative.   Respiratory: Positive for cough, chest tightness and wheezing. Negative for apnea, choking and stridor.   Cardiovascular: Negative.   Gastrointestinal: Negative.   Genitourinary: Negative.   Musculoskeletal: Negative.   Skin: Negative.   Allergic/Immunologic: Negative.   Neurological: Negative.   Hematological: Negative.   Psychiatric/Behavioral: Negative.      Allergies  Ibuprofen  Home Medications   Prior to Admission medications   Medication Sig Start Date End Date Taking? Authorizing Provider  aspirin EC 81 MG tablet Take by mouth.   Yes Historical Provider, MD  DHA-EPA-VITAMIN E PO Take by mouth.   Yes Historical Provider, MD  metoprolol succinate (TOPROL-XL) 25 MG 24 hr tablet Take 0.5 tablets by mouth daily.  11/20/12  Yes Historical Provider, MD  Na Sulfate-K Sulfate-Mg Sulf SOLN Reported on 03/24/2015 12/25/13  Yes Historical Provider, MD  Omega-3 Fatty Acids (FISH OIL PO) Take by mouth daily.   Yes Historical Provider, MD  albuterol (PROVENTIL HFA;VENTOLIN HFA) 108 (90 BASE) MCG/ACT inhaler Inhale 2 puffs into the lungs every 4 (four) hours as needed for wheezing or shortness of breath. 03/27/15   Andria Meuse, NP  chlorpheniramine-HYDROcodone (TUSSIONEX PENNKINETIC ER) 10-8 MG/5ML SUER Take 5 mLs by mouth every 12 (twelve) hours as needed for cough. 03/27/15   Andria Meuse, NP   Meds Ordered and Administered this Visit  Medications - No data to display  BP 124/89 mmHg  Pulse 76  Temp(Src) 98.1 F (36.7 C) (Oral)  Resp 18  Ht 6\' 2"  (1.88 m)  Wt 235 lb (106.595 kg)  BMI 30.16 kg/m2  SpO2 98% No data found.   Physical Exam  Constitutional: He is oriented to person, place, and time. He  appears well-developed and well-nourished. No distress.  HENT:  Head: Normocephalic and atraumatic.  Right Ear: Hearing, tympanic membrane, external ear and ear canal normal. Tympanic membrane is not injected, not erythematous and not bulging. No middle ear effusion.  Left Ear: Hearing, tympanic membrane, external ear and ear canal normal. Tympanic membrane is not injected, not erythematous and not bulging.  No middle ear effusion.  Nose: Nose normal. Right sinus exhibits no maxillary sinus tenderness and no frontal sinus tenderness. Left sinus exhibits no maxillary sinus tenderness and no frontal sinus tenderness.  Mouth/Throat: Uvula is  midline, oropharynx is clear and moist and mucous membranes are normal.  Eyes: Conjunctivae are normal. No scleral icterus.  Neck: Normal range of motion.  Cardiovascular: Normal rate and regular rhythm.   Pulmonary/Chest: Effort normal. No accessory muscle usage. No tachypnea. No respiratory distress. He has wheezes in the right middle field, the right lower field and the left lower field. He has no rhonchi. He has no rales.  Abdominal: Soft. Bowel sounds are normal. He exhibits no distension.  Musculoskeletal: Normal range of motion. He exhibits no edema or tenderness.  Neurological: He is alert and oriented to person, place, and time. No cranial nerve deficit.  Skin: Skin is warm and dry. No rash noted. He is not diaphoretic. No cyanosis or erythema. No pallor. Nails show no clubbing.  Psychiatric: His behavior is normal. Judgment normal.  Nursing note and vitals reviewed.   ED Course  Procedures n/a  Imaging Review Dg Chest 2 View  03/27/2015  CLINICAL DATA:  59 year old male with cough and wheezing for 2 weeks. EXAM: CHEST  2 VIEW COMPARISON:  05/27/2013 and prior exams FINDINGS: The cardiomediastinal silhouette is unremarkable. Elevation of the right hemidiaphragm is again noted with right basilar scarring. There is no evidence of focal airspace disease, pulmonary edema, suspicious pulmonary nodule/mass, pleural effusion, or pneumothorax. No acute bony abnormalities are identified. IMPRESSION: No active cardiopulmonary disease. Electronically Signed   By: Margarette Canada M.D.   On: 03/27/2015 10:37    MDM   1. Bronchitis    Explained to patient this is likely viral. Cough and symptoms may persist for up to 3 weeks at times. Continue supportive measures. Reassured and discussed warning signs for patient to return to clinic including SOB, weakness, unrelenting fever, severe pain or worsening symptoms.  Plan: Diagnosis reviewed with patient/mother Ibuprofen 400-600mg  TID PRN Mucinex 1-2  tabs BID PRN Rx as per orders;  benefits, risks, potential side effects reviewed  Recommend supportive treatment with rest increase fluids PCP in 1 week or sooner if worsening shortness of breath  Andria Meuse, NP 03/27/15 1117

## 2015-03-27 NOTE — ED Notes (Signed)
Patient c/o coug and chest congestion. Per patient was seen a couple weeks ago for the same symptoms and was given a z-pak. Patient stated cough started again and worse lat pm.

## 2015-03-29 ENCOUNTER — Telehealth: Payer: Self-pay | Admitting: Radiology

## 2015-03-29 NOTE — Telephone Encounter (Signed)
Notified pt's wife, Kelynn Stavely, of surgery scheduled 04/19/15, pre-admit testing appt on 04/12/15 @9 :00 and to call day prior to surgery for arrival time to SDS. [name] voices understanding.

## 2015-04-05 NOTE — Telephone Encounter (Signed)
Per Dr Stacie Glaze instruction, notified pt's wife that he should hold ASA 81mg  7 days prior to procedure with last dose taken on 04/11/15. Wife voices understanding.

## 2015-04-12 ENCOUNTER — Other Ambulatory Visit: Payer: BLUE CROSS/BLUE SHIELD

## 2015-04-12 ENCOUNTER — Encounter
Admission: RE | Admit: 2015-04-12 | Discharge: 2015-04-12 | Disposition: A | Discharge status: Home / Self Care | Payer: Managed Care, Other (non HMO) | Source: Ambulatory Visit | Attending: Urology | Admitting: Urology

## 2015-04-12 DIAGNOSIS — Z01812 Encounter for preprocedural laboratory examination: Secondary | ICD-10-CM | POA: Diagnosis present

## 2015-04-12 DIAGNOSIS — I10 Essential (primary) hypertension: Secondary | ICD-10-CM | POA: Diagnosis not present

## 2015-04-12 HISTORY — DX: Benign prostatic hyperplasia without lower urinary tract symptoms: N40.0

## 2015-04-12 HISTORY — DX: Pure hyperglyceridemia: E78.1

## 2015-04-12 HISTORY — DX: Bronchitis, not specified as acute or chronic: J40

## 2015-04-12 NOTE — Patient Instructions (Signed)
  Your procedure is scheduled on: April 19, 2015 (Tuesday) Report to Day Surgery.[facility]) To find out your arrival time please call [phone] between 1PM - 3PM on April 18, 2015 (Monday).  Remember: Instructions that are not followed completely may result in serious medical risk, up to and including death, or upon the discretion of your surgeon and anesthesiologist your surgery may need to be rescheduled.    _x__ 1. Do not eat food or drink liquids after midnight. No gum chewing or hard candies.     ____ 2. No Alcohol for 24 hours before or after surgery.   ____ 3. Bring all medications with you on the day of surgery if instructed.    _x___ 4. Notify your doctor if there is any change in your medical condition     (cold, fever, infections).     Do not wear jewelry, make-up, hairpins, clips or nail polish.  Do not wear lotions, powders, or perfumes. You may wear deodorant.  Do not shave 48 hours prior to surgery. Men may shave face and neck.  Do not bring valuables to the hospital.    [facility] is not responsible for any belongings or valuables.               Contacts, dentures or bridgework may not be worn into surgery.  Leave your suitcase in the car. After surgery it may be brought to your room.  For patients admitted to the hospital, discharge time is determined by your                treatment team.   Patients discharged the day of surgery will not be allowed to drive home.   Please read over the following fact sheets that you were given:   Surgical Site Infection Prevention   ____ Take these medicines the morning of surgery with A SIP OF WATER:    1.   2.   3.   4.  5.  6.  ____ Fleet Enema (as directed)   ____ Use CHG Soap as directed  _x___ Use inhalers on the day of surgery (Albuterol inhaler the day of surgery)  ____ Stop metformin 2 days prior to surgery    ____ Take 1/2 of usual insulin dose the night before surgery and none on the morning of  surgery.   _x___ Stop Coumadin/Plavix/aspirin on (Patient has stopped Aspirin one week ago)  ____ Stop Anti-inflammatories on    _x___ Stop supplements until after surgery.  (Stop Vitamin E and Fish Oil now)  ____ Bring C-Pap to the hospital.

## 2015-04-19 ENCOUNTER — Ambulatory Visit
Admission: RE | Admit: 2015-04-19 | Discharge: 2015-04-19 | Disposition: A | Discharge status: Home / Self Care | Payer: Managed Care, Other (non HMO) | Source: Ambulatory Visit | Attending: Urology | Admitting: Urology

## 2015-04-19 ENCOUNTER — Encounter: Payer: Self-pay | Admitting: *Deleted

## 2015-04-19 ENCOUNTER — Ambulatory Visit: Payer: Managed Care, Other (non HMO) | Admitting: Certified Registered"

## 2015-04-19 ENCOUNTER — Encounter
Admission: RE | Disposition: A | Discharge status: Home / Self Care | Payer: Self-pay | Source: Ambulatory Visit | Attending: Urology

## 2015-04-19 DIAGNOSIS — D176 Benign lipomatous neoplasm of spermatic cord: Secondary | ICD-10-CM | POA: Diagnosis not present

## 2015-04-19 DIAGNOSIS — I10 Essential (primary) hypertension: Secondary | ICD-10-CM | POA: Insufficient documentation

## 2015-04-19 DIAGNOSIS — Z886 Allergy status to analgesic agent status: Secondary | ICD-10-CM | POA: Diagnosis not present

## 2015-04-19 DIAGNOSIS — N4341 Spermatocele of epididymis, single: Secondary | ICD-10-CM | POA: Diagnosis not present

## 2015-04-19 DIAGNOSIS — Z7982 Long term (current) use of aspirin: Secondary | ICD-10-CM | POA: Diagnosis not present

## 2015-04-19 DIAGNOSIS — N4340 Spermatocele of epididymis, unspecified: Secondary | ICD-10-CM

## 2015-04-19 DIAGNOSIS — N5089 Other specified disorders of the male genital organs: Secondary | ICD-10-CM | POA: Diagnosis present

## 2015-04-19 DIAGNOSIS — Z79899 Other long term (current) drug therapy: Secondary | ICD-10-CM | POA: Insufficient documentation

## 2015-04-19 HISTORY — PX: SPERMATOCELECTOMY: SHX2420

## 2015-04-19 SURGERY — EXCISION, SPERMATOCELE
Anesthesia: General | Laterality: Right | Wound class: Clean Contaminated

## 2015-04-19 MED ORDER — ONDANSETRON HCL 4 MG/2ML IJ SOLN: 4.0000 mg | Freq: Once | INTRAMUSCULAR | Status: DC | PRN

## 2015-04-19 MED ORDER — DEXAMETHASONE SODIUM PHOSPHATE 10 MG/ML IJ SOLN
INTRAMUSCULAR | Status: DC | PRN
  Administered 2015-04-19: 10 mg via INTRAVENOUS

## 2015-04-19 MED ORDER — FAMOTIDINE 20 MG PO TABS
ORAL_TABLET | ORAL | Status: AC
  Filled 2015-04-19: qty 1

## 2015-04-19 MED ORDER — LACTATED RINGERS IV SOLN
INTRAVENOUS | Status: DC
  Administered 2015-04-19: 08:00:00 via INTRAVENOUS

## 2015-04-19 MED ORDER — BUPIVACAINE HCL 0.5 % IJ SOLN
INTRAMUSCULAR | Status: DC | PRN
  Administered 2015-04-19 (×2): 10 mL

## 2015-04-19 MED ORDER — BUPIVACAINE HCL (PF) 0.5 % IJ SOLN
INTRAMUSCULAR | Status: AC
  Filled 2015-04-19: qty 30

## 2015-04-19 MED ORDER — CEFAZOLIN SODIUM 1-5 GM-% IV SOLN
INTRAVENOUS | Status: AC
  Filled 2015-04-19: qty 50

## 2015-04-19 MED ORDER — MIDAZOLAM HCL 2 MG/2ML IJ SOLN
INTRAMUSCULAR | Status: DC | PRN
  Administered 2015-04-19: 2 mg via INTRAVENOUS

## 2015-04-19 MED ORDER — FENTANYL CITRATE (PF) 100 MCG/2ML IJ SOLN
INTRAMUSCULAR | Status: DC | PRN
  Administered 2015-04-19: 25 ug via INTRAVENOUS
  Administered 2015-04-19: 50 ug via INTRAVENOUS

## 2015-04-19 MED ORDER — ONDANSETRON HCL 4 MG/2ML IJ SOLN
INTRAMUSCULAR | Status: DC | PRN
  Administered 2015-04-19: 4 mg via INTRAVENOUS

## 2015-04-19 MED ORDER — PROPOFOL 10 MG/ML IV BOLUS
INTRAVENOUS | Status: DC | PRN
  Administered 2015-04-19: 200 mg via INTRAVENOUS

## 2015-04-19 MED ORDER — PHENYLEPHRINE HCL 10 MG/ML IJ SOLN
INTRAMUSCULAR | Status: DC | PRN
  Administered 2015-04-19: 200 ug via INTRAVENOUS
  Administered 2015-04-19 (×2): 100 ug via INTRAVENOUS

## 2015-04-19 MED ORDER — CEFAZOLIN SODIUM 1-5 GM-% IV SOLN
1.0000 g | Freq: Once | INTRAVENOUS | Status: AC
  Administered 2015-04-19: 1 g via INTRAVENOUS

## 2015-04-19 MED ORDER — LIDOCAINE HCL (PF) 1 % IJ SOLN
INTRAMUSCULAR | Status: AC
Start: 2015-04-19 — End: 2015-04-19
  Filled 2015-04-19: qty 30

## 2015-04-19 MED ORDER — HYDROCODONE-ACETAMINOPHEN 5-325 MG PO TABS: 1.0000 | ORAL_TABLET | Freq: Four times a day (QID) | ORAL | Status: DC | PRN

## 2015-04-19 MED ORDER — FAMOTIDINE 20 MG PO TABS
20.0000 mg | ORAL_TABLET | Freq: Once | ORAL | Status: AC
  Administered 2015-04-19: 20 mg via ORAL

## 2015-04-19 MED ORDER — LIDOCAINE HCL (CARDIAC) 20 MG/ML IV SOLN
INTRAVENOUS | Status: DC | PRN
  Administered 2015-04-19: 100 mg via INTRAVENOUS

## 2015-04-19 MED ORDER — DOCUSATE SODIUM 100 MG PO CAPS: 100.0000 mg | ORAL_CAPSULE | Freq: Two times a day (BID) | ORAL | Status: DC

## 2015-04-19 MED ORDER — FENTANYL CITRATE (PF) 100 MCG/2ML IJ SOLN: 25.0000 ug | INTRAMUSCULAR | Status: DC | PRN

## 2015-04-19 SURGICAL SUPPLY — 32 items
CANISTER SUCT 1200ML W/VALVE (MISCELLANEOUS) ×3 IMPLANT
CHLORAPREP W/TINT 26ML (MISCELLANEOUS) ×3 IMPLANT
DRAIN PENROSE 1/4X12 LTX (DRAIN) IMPLANT
DRAPE LAPAROTOMY 77X122 PED (DRAPES) ×3 IMPLANT
GAUZE FLUFF 18X24 1PLY STRL (GAUZE/BANDAGES/DRESSINGS) ×3 IMPLANT
GAUZE SPONGE 4X4 12PLY STRL (GAUZE/BANDAGES/DRESSINGS) ×3 IMPLANT
GAUZE STRETCH 2X75IN STRL (MISCELLANEOUS) ×3 IMPLANT
GLOVE BIO SURGEON STRL SZ 6.5 (GLOVE) ×2 IMPLANT
GLOVE BIO SURGEON STRL SZ7 (GLOVE) ×3 IMPLANT
GLOVE BIO SURGEONS STRL SZ 6.5 (GLOVE) ×1
GOWN STRL REUS W/ TWL LRG LVL3 (GOWN DISPOSABLE) ×2 IMPLANT
GOWN STRL REUS W/TWL LRG LVL3 (GOWN DISPOSABLE) ×4
KIT RM TURNOVER STRD PROC AR (KITS) ×3 IMPLANT
LABEL OR SOLS (LABEL) ×3 IMPLANT
LIQUID BAND (GAUZE/BANDAGES/DRESSINGS) ×3 IMPLANT
NEEDLE HYPO 25X1 1.5 SAFETY (NEEDLE) ×3 IMPLANT
NS IRRIG 500ML POUR BTL (IV SOLUTION) ×3 IMPLANT
PACK BASIN MINOR ARMC (MISCELLANEOUS) ×3 IMPLANT
PAD GROUND ADULT SPLIT (MISCELLANEOUS) ×3 IMPLANT
PREP PVP WINGED SPONGE (MISCELLANEOUS) IMPLANT
SOL PREP PVP 2OZ (MISCELLANEOUS)
SOLUTION PREP PVP 2OZ (MISCELLANEOUS) IMPLANT
SUPPORETR ATHLETIC LG (MISCELLANEOUS) ×1 IMPLANT
SUPPORTER ATHLETIC LG (MISCELLANEOUS) ×3
SUT CHROMIC 3 0 SH 27 (SUTURE) ×3 IMPLANT
SUT ETHILON 3-0 FS-10 30 BLK (SUTURE) ×3
SUT VIC AB 3-0 SH 27 (SUTURE) ×4
SUT VIC AB 3-0 SH 27X BRD (SUTURE) ×2 IMPLANT
SUT VIC AB 4-0 SH 27 (SUTURE) ×2
SUT VIC AB 4-0 SH 27XANBCTRL (SUTURE) ×1 IMPLANT
SUTURE EHLN 3-0 FS-10 30 BLK (SUTURE) ×1 IMPLANT
SYRINGE 10CC LL (SYRINGE) ×3 IMPLANT

## 2015-04-19 NOTE — Transfer of Care (Signed)
Immediate Anesthesia Transfer of Care Note  Patient: Bruce Hester  Procedure(s) Performed: Procedure(s) with comments: SPERMATOCELECTOMY (Right) - LEFT SCROTAL EXPLORATION  Patient Location: PACU  Anesthesia Type:General  Level of Consciousness: awake, alert , oriented and patient cooperative  Airway & Oxygen Therapy: Patient Spontanous Breathing and Patient connected to face mask oxygen  Post-op Assessment: Report given to RN, Post -op Vital signs reviewed and stable and Patient moving all extremities X 4  Post vital signs: Reviewed and stable  Last Vitals:  Filed Vitals:   04/19/15 0705 04/19/15 0950  BP: 131/105 117/67  Pulse: 83 81  Temp: 35.4 C 36.2 C  Resp: 18 12    Complications: No apparent anesthesia complications

## 2015-04-19 NOTE — Anesthesia Procedure Notes (Signed)
Procedure Name: LMA Insertion Date/Time: 04/19/2015 8:47 AM Performed by: Silvana Newness Pre-anesthesia Checklist: Patient identified, Emergency Drugs available, Suction available, Patient being monitored and Timeout performed Patient Re-evaluated:Patient Re-evaluated prior to inductionOxygen Delivery Method: Circle system utilized Preoxygenation: Pre-oxygenation with 100% oxygen Intubation Type: IV induction Ventilation: Mask ventilation without difficulty LMA: LMA inserted LMA Size: 4.5 Number of attempts: 1 Placement Confirmation: positive ETCO2 and breath sounds checked- equal and bilateral Tube secured with: Tape Dental Injury: Teeth and Oropharynx as per pre-operative assessment

## 2015-04-19 NOTE — H&P (View-Only) (Signed)
10:02 AM   Bruce Hester [birth year] [id number]  Referring provider: Idelle Crouch, MD [facility] [place], [place] [address]  Chief Complaint  Patient presents with  . Results    [place] srotal    HPI: Patient is a 60 year old white male who presents for his scrotal ultrasound report.  Background story Patient states his wife, [name], noticed that his testicles had become 2 months ago.  The patient stated he had noticed. Since his wife has made that observation he has not noticed any change in the scrotum. He is not finding the swelling, comfortable. He is not had trauma or infection to the scrotal area.  He does have a history of an umbilical hernia which was repaired in 2014.    He has baseline urinary symptoms of urgency, nocturia 2, hesitancy and weak urinary stream. He states that these symptoms he can a few months ago. He has not had dysuria, gross hematuria or suprapubic pain.  He states he has his prostate examined with his primary care physician. His last PSA was 0.2 ng/mL on 03/17/2015.  He also had the onset of erectile dysfunction 2 weeks ago. He states that his erection was not firm enough for intercourse. He denied any pain or curvature with erection. He attributed to an increase in his blood pressure medication.  The dysfunction seems to have resolved for now.  Today, he states he has not had any changes with his scrotum.  He has not had any recent fevers, chills, nausea or vomiting.  I have reviewed the images with the patient and his wife.    PMH: Past Medical History  Diagnosis Date  . Hypertension   . Umbilical hernia     Surgical History: Past Surgical History  Procedure Laterality Date  . Hernia repair  123456    umbilical    Home Medications:    Medication List       This list is accurate as of: 03/24/15 10:02 AM.  Always use your most recent med list.               albuterol 108 (90 BASE) MCG/ACT inhaler    Commonly known as:  PROVENTIL HFA;VENTOLIN HFA  Inhale 2 puffs into the lungs every 4 (four) hours as needed for wheezing or shortness of breath.     aspirin EC 81 MG tablet  Take by mouth.     azithromycin 250 MG tablet  Commonly known as:  ZITHROMAX Z-PAK  Take 2 tablets first day and then 1 po a day for 4 days     chlorpheniramine-HYDROcodone 10-8 MG/5ML Suer  Commonly known as:  TUSSIONEX PENNKINETIC ER  Take 5 mLs by mouth every 12 (twelve) hours as needed for cough.     DHA-EPA-VITAMIN E PO  Take by mouth.     FISH OIL PO  Take by mouth daily.     metoprolol succinate 25 MG 24 hr tablet  Commonly known as:  TOPROL-XL  Take 0.5 tablets by mouth daily.     Na Sulfate-K Sulfate-Mg Sulf Soln  Reported on 03/24/2015        Allergies:  Allergies  Allergen Reactions  . Ibuprofen Rash    High doses    Family History: Family History  Problem Relation Age of Onset  . Kidney disease Neg Hx   . Prostate cancer Neg Hx   . Cancer Neg Hx   . Heart disease Father  Social History:  reports that he has never smoked. He has never used smokeless tobacco. He reports that he does not drink alcohol or use illicit drugs.  ROS: UROLOGY Frequent Urination?: No Hard to postpone urination?: Yes Burning/pain with urination?: No Get up at night to urinate?: No Leakage of urine?: No Urine stream starts and stops?: No Trouble starting stream?: No Do you have to strain to urinate?: No Blood in urine?: No Urinary tract infection?: No Sexually transmitted disease?: No Injury to kidneys or bladder?: No Painful intercourse?: No Weak stream?: Yes Erection problems?: No Penile pain?: No  Gastrointestinal Nausea?: No Vomiting?: No Indigestion/heartburn?: No Diarrhea?: No Constipation?: No  Constitutional Fever: No Night sweats?: No Weight loss?: No Fatigue?: No  Skin Skin rash/lesions?: No Itching?: No  Eyes Blurred vision?: Yes Double vision?:  No  Ears/Nose/Throat Sore throat?: No Sinus problems?: No  Hematologic/Lymphatic Swollen glands?: No Easy bruising?: No  Cardiovascular Leg swelling?: No Chest pain?: No  Respiratory Cough?: No Shortness of breath?: No  Endocrine Excessive thirst?: No  Musculoskeletal Back pain?: No Joint pain?: Yes  Neurological Headaches?: No Dizziness?: No  Psychologic Depression?: No Anxiety?: No  Physical Exam: BP 158/99 mmHg  Pulse 65  Ht 6\' 4"  (1.93 m)  Wt 232 lb 6.4 oz (105.416 kg)  BMI 28.30 kg/m2  Constitutional: Well nourished. Alert and oriented, No acute distress. HEENT: [place] AT, moist mucus membranes. Trachea midline, no masses. Cardiovascular: No clubbing, cyanosis, or edema. Respiratory: Normal respiratory effort, no increased work of breathing. GI: Abdomen is soft, non tender, non distended, no abdominal masses. Liver and spleen not palpable.  No hernias appreciated.  Stool sample for occult testing is not indicated.   GU: No CVA tenderness.  No bladder fullness or masses.  Patient with circumcised phallus.  Urethral meatus is patent.  No penile discharge. No penile lesions or rashes. Scrotum without lesions, cysts, rashes and/or edema.  Bilateral hydroceles are noted.  Testicles could not be palpated due to the hydroceles. No masses are appreciated in the testicles. Left and right epididymis are normal. Rectal: Patient with  normal sphincter tone. Anus and perineum without scarring or rashes. No rectal masses are appreciated. Prostate is approximately 50 grams, (could not palpated entire gland due to buttocks tissue). Skin: No rashes, bruises or suspicious lesions. Lymph: No cervical or inguinal adenopathy. Neurologic: Grossly intact, no focal deficits, moving all 4 extremities. Psychiatric: Normal mood and affect.  Pertinent Imaging CLINICAL DATA: Bilateral scrotal swelling for 2 months  EXAM: SCROTAL ULTRASOUND  DOPPLER ULTRASOUND OF THE  TESTICLES  TECHNIQUE: Complete ultrasound examination of the testicles, epididymis, and other scrotal structures was performed. Color and spectral Doppler ultrasound were also utilized to evaluate blood flow to the testicles.  COMPARISON: None.  FINDINGS: Right testicle  Measurements: 4.4 x 2.6 x 3.5 cm. No mass or microlithiasis visualized.  Left testicle  Measurements: 4.6 x 2.2 x 3.1 cm. No mass or microlithiasis visualized.  Right epididymis: Normal in size and appearance.  Left epididymis: Normal in size and appearance.  Hydrocele: Moderate-sized right hydrocele.  Varicocele: None visualized.  Pulsed Doppler interrogation of both testes demonstrates normal low resistance arterial and venous waveforms bilaterally.  IMPRESSION: 1. No testicular torsion. No testicular mass. 2. Right hydrocele.   Electronically Signed  By: Kathreen Devoid  On: 03/21/2015 10:51  Assessment & Plan:    1. Scrotal swelling:   The scrotal swelling is a right hydrocele.  I explained to the patient how the hydrocelectomy is performed and  the risk factors, such as: injury to spermatic vessels can occur, affecting fertility,  bleeding from the surgical incision, internal bleeding, infection and swelling of the scrotum for up to a month.  I also informed him of the recovery period of being able to resume most activities within seven to 10 days, although heavy lifting and sexual activities may be delayed for up to six weeks.I also explained the risks of general anesthesia, such as: MI, CVA, paralysis, coma and/or death.   He would like to pursue a hydrocelectomy, but he will need to talk with his employers prior to scheduling.  -urinalysis -urine culture  2. BPH with LUTS:   Patient is having mild obstructive symptoms at this time.  His PSA has returned at 0.2 ng/mL on 03/17/2015.  I have suggested that he have annual prostate exams and PSA's.    3. Erectile dysfunction:   Patient had a the recent onset of ED 2 weeks ago which he attributes to his BP medication.  It has seemed to resolved for the time being.  We will continue to monitor.    Return for patient to call to schedule hydrocelectomy.  Zara Council, [facility] [address] [place], [place] [address] [phone]

## 2015-04-19 NOTE — Anesthesia Postprocedure Evaluation (Signed)
Anesthesia Post Note  Patient: Bruce Hester  Procedure(s) Performed: Procedure(s) (LRB): SPERMATOCELECTOMY (Right)  Patient location during evaluation: PACU Anesthesia Type: General Level of consciousness: awake and alert Pain management: pain level controlled Vital Signs Assessment: post-procedure vital signs reviewed and stable Respiratory status: spontaneous breathing, nonlabored ventilation, respiratory function stable and patient connected to nasal cannula oxygen Cardiovascular status: blood pressure returned to baseline and stable Postop Assessment: no signs of nausea or vomiting Anesthetic complications: no    Last Vitals:  Filed Vitals:   04/19/15 1110 04/19/15 1112  BP:  134/92  Pulse: 74   Temp:    Resp: 18     Last Pain:  Filed Vitals:   04/19/15 1112  PainSc: 2                  Martha Clan

## 2015-04-19 NOTE — Op Note (Signed)
Date of procedure: 04/19/2015  Preoperative diagnosis:  1. 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 Right spermatocelectomy 2. Left scrotal exploration  Surgeon: Hollice Espy, MD  Anesthesia: General  Complications: None  Intraoperative findings: As above  EBL: Normal  Specimens: Right spermatocele  Drains: None  Indication: Bruce Hester is a 60 y.o. patient with right scrotal swelling which was symptomatic.  Examination in the preoperative area, his exam was more consistent with a right spermatocele. He also had some fullness in the left side as well. He was consented at that point for possible bilateral intervention if deemed necessary.  After reviewing the management options for treatment, he elected to proceed with the above surgical procedure(s). We have discussed the potential benefits and risks of the procedure, side effects of the proposed treatment, the likelihood of the patient achieving the goals of the procedure, and any potential problems that might occur during the procedure or recuperation. Informed consent has been obtained.  Description of procedure:  The patient was taken to the operating room and general anesthesia was induced. The patient was placed in the supine position, shaved by the attending surgeon, prepped and draped in the usual sterile fashion, and preoperative antibiotics were administered. A preoperative time-out was performed.   A 4cm long incision was created along the median raphae in the midline of the scrotum after local anesthetic was injected in subcutaneous tissues. The incision was opened and then using Bovie electrocautery through the dartos layer and the right testicle within tunica vaginalis was delivered. Tunica vaginalis was opened using Bovie electrocautery to expose the right testicle epididymis, and spermatocele sac adjacent to the epididymis and superior  to the testicle. It measured approximately 6 cm in diameter. This point in time, careful dissection was performed using delicate mosquitoes and electrocautery to separate the spermatocele sac from the testicle and the epididymis. Care was taken to avoid any injury to these other structures. A a small hole was created in the spermatocele which was oversewn using chromic to maintain its shape. Eventually, the entire sac was able to be freed the surrounding structures and was passed off the field. Hemostasis was carefully achieved. The testicle and epididymis were carefully inspected and noted to be hemostatic and without any injuries. They were both well-perfused. At this point, the wound was copiously irrigated. The right testicle was returned back into the tunica vaginalis which was oversewn using running 3-0 chromic suture. The right testicle was then returned back into the anatomic position within the scrotum. Bleeders within the dartos layer were carefully ablated using Bovie electrocautery.   Given the fullness of his left hemiscrotum, the left testicle was delivered by opening the dartos through the septum. Upon inspection of the testicle, there is no significant fluid within tunica vaginalis and no spermatocele identified. He did have a very thickened cord consistent with a large lipoma of the cord.  As no surgical intervention was warranted for this, the left testicle within tunica vaginalis was returned back to the left hemiscrotum.  The dartos was then closed using a running 3-0 Vicryl suture. The overlying skin was closed using a series of interrupted 3-0 chromic sutures. The skin was cleaned and dried. A dressing of Dermabond was applied to the wound. Once completely dry, fluffs and a scrotal support device were applied. Of note, prior to closing the wound, I did perform a right cord block for additional local anesthetic.  He was then reversed from anesthesia and taken to the PACU in stable condition.  There were no complications in this case.  Hollice Espy, M.D.

## 2015-04-19 NOTE — Anesthesia Preprocedure Evaluation (Signed)
Anesthesia Evaluation  Patient identified by MRN, date of birth, ID band Patient awake    Reviewed: Allergy & Precautions, H&P , NPO status , Patient's Chart, lab work & pertinent test results, reviewed documented beta blocker date and time   History of Anesthesia Complications Negative for: history of anesthetic complications  Airway Mallampati: II  TM Distance: >3 FB Neck ROM: full    Dental no notable dental hx. (+) Caps, Teeth Intact   Pulmonary neg shortness of breath, neg sleep apnea, neg COPD, Recent URI , Resolved,  Bronchitis 2 weeks ago   Pulmonary exam normal breath sounds clear to auscultation       Cardiovascular Exercise Tolerance: Good hypertension, On Medications and On Home Beta Blockers (-) angina(-) CAD, (-) Past MI, (-) Cardiac Stents and (-) CABG Normal cardiovascular exam(-) dysrhythmias (-) Valvular Problems/Murmurs Rhythm:regular Rate:Normal     Neuro/Psych negative neurological ROS  negative psych ROS   GI/Hepatic negative GI ROS, Neg liver ROS,   Endo/Other  negative endocrine ROS  Renal/GU negative Renal ROS  negative genitourinary   Musculoskeletal   Abdominal   Peds  Hematology negative hematology ROS (+)   Anesthesia Other Findings Past Medical History:   Hypertension                                                 Umbilical hernia                                             BPH (benign prostatic hypertrophy)                           Hypertriglyceridemia                                         Bronchitis                                                   Reproductive/Obstetrics negative OB ROS                             Anesthesia Physical Anesthesia Plan  ASA: II  Anesthesia Plan: General   Post-op Pain Management:    Induction:   Airway Management Planned:   Additional Equipment:   Intra-op Plan:   Post-operative Plan:   Informed  Consent: I have reviewed the patients History and Physical, chart, labs and discussed the procedure including the risks, benefits and alternatives for the proposed anesthesia with the patient or authorized representative who has indicated his/her understanding and acceptance.   Dental Advisory Given  Plan Discussed with: Anesthesiologist, CRNA and Surgeon  Anesthesia Plan Comments:         Anesthesia Quick Evaluation

## 2015-04-19 NOTE — Interval H&P Note (Signed)
History and Physical Interval Note:  04/19/2015 8:21 AM  Bruce Hester  has presented today for surgery, with the diagnosis of HYDROCELE  The various methods of treatment have been discussed with the patient and family. After consideration of risks, benefits and other options for treatment, the patient has consented to  Procedure(s): HYDROCELECTOMY ADULT (Right) as a surgical intervention .  The patient's history has been reviewed, patient examined, no change in status, stable for surgery.  I have reviewed the patient's chart and labs.  Questions were answered to the patient's satisfaction.     Hollice Espy

## 2015-04-19 NOTE — Interval H&P Note (Signed)
History and Physical Interval Note:  04/19/2015 8:23 AM  Bruce Hester  has presented today for surgery, with the diagnosis of HYDROCELE  The various methods of treatment have been discussed with the patient and family. After consideration of risks, benefits and other options for treatment, the patient has consented to  Procedure(s): HYDROCELECTOMY ADULT (Right) as a surgical intervention .  The patient's history has been reviewed, patient examined, no change in status, stable for surgery.  I have reviewed the patient's chart and labs.  Questions were answered to the patient's satisfaction.    RRR CTAB   Hollice Espy

## 2015-04-19 NOTE — Discharge Instructions (Signed)
Spermatocelectomy/ Hydrocelectomy, Care After Refer to this sheet in the next few weeks. These instructions provide you with information about caring for yourself after your procedure. Your health care provider may also give you more specific instructions. Your treatment has been planned according to current medical practices, but problems sometimes occur. Call your health care provider if you have any problems or questions after your procedure. WHAT TO EXPECT AFTER THE PROCEDURE After your procedure, it is common for the pouch that holds your testicles (scrotum) to be painful, swollen, and bruised. HOME CARE INSTRUCTIONS Bathing  Ask your health care provider when you can shower, take baths, or go swimming.  If you were told to wear an athletic support strap, take it off when you shower or take a bath. Incision Care  Follow instructions from your health care provider about how to take care of your incision. Make sure you:  Wash your hands with soap and water before you change your bandage (dressing). If soap and water are not available, use hand sanitizer.  Change your dressing as told by your health care provider.  Leave stitches (sutures) in place.  Check your incision and scrotum every day for signs of infection. Check for:  More redness, swelling, or pain.  Blood or fluid.  Warmth.  Pus or a bad smell. Managing Pain, Stiffness, and Swelling  If directed, apply ice to the injured area:  Put ice in a plastic bag.  Place a towel between your skin and the bag.  Leave the ice on for 20 minutes, 2-3 times per day. Driving  Do not drive for 24 hours if you received a sedative.  Do not drive or operate heavy machinery while taking prescription pain medicine.  Ask your health care provider when it is safe to drive. Activity  Do not do any activities that require great strength and energy (are vigorous) for as long as told by your health care provider.  Return to your  normal activities as told by your health care provider. Ask your health care provider what activities are safe for you.  Do not lift anything that is heavier than 10 lb (4.5 kg) until your health care provider says that it is safe. General Instructions  Take over-the-counter and prescription medicines only as told by your health care provider.  Keep all follow-up visits as told by your health care provider. This is important.  If you were given an athletic support strap, wear it as told by your health care provider.  If you had a drain put in during the procedure, you will need to return to have it removed. SEEK MEDICAL CARE IF:  Your pain gets worse.  You have more redness, swelling, or pain around your scrotum.  You have blood or fluid coming from your scrotum.  Your incision feels warm to the touch.  You have pus or a bad smell coming from your scrotum.  You have a fever.   This information is not intended to replace advice given to you by your health care provider. Make sure you discuss any questions you have with your health care provider.   Document Released: 12/15/2014 Document Reviewed: 09/22/2014 Elsevier Interactive Patient Education 2016 [place]   1) The drugs that you were given will stay in your system until tomorrow so for the next 24 hours you should not:  A) Drive an automobile B) Make any legal decisions C) Drink any alcoholic beverage   2) You may  resume regular meals tomorrow.  Today it is better to start with liquids and gradually work up to solid foods.  You may eat anything you prefer, but it is better to start with liquids, then soup and crackers, and gradually work up to solid foods.   3) Please notify your doctor immediately if you have any unusual bleeding, trouble breathing, redness and pain at the surgery site, drainage, fever, or pain not relieved by medication.    4) Additional  Instructions:        Please contact your physician with any problems or Same Day Surgery at [phone], Monday through Friday 6 am to 4 pm, or [place] at [facility] number at [phone].

## 2015-04-20 LAB — SURGICAL PATHOLOGY

## 2015-05-02 ENCOUNTER — Telehealth: Payer: Self-pay

## 2015-05-06 ENCOUNTER — Encounter: Payer: Self-pay | Admitting: Urology

## 2015-05-06 ENCOUNTER — Ambulatory Visit (INDEPENDENT_AMBULATORY_CARE_PROVIDER_SITE_OTHER): Payer: 59 | Admitting: Urology

## 2015-05-06 VITALS — BP 157/105 | HR 68 | Ht 73.0 in | Wt 232.7 lb

## 2015-05-06 DIAGNOSIS — B351 Tinea unguium: Secondary | ICD-10-CM | POA: Insufficient documentation

## 2015-05-06 DIAGNOSIS — N4341 Spermatocele of epididymis, single: Secondary | ICD-10-CM

## 2015-05-06 DIAGNOSIS — N400 Enlarged prostate without lower urinary tract symptoms: Secondary | ICD-10-CM | POA: Insufficient documentation

## 2015-05-06 DIAGNOSIS — I10 Essential (primary) hypertension: Secondary | ICD-10-CM | POA: Insufficient documentation

## 2015-05-06 NOTE — Progress Notes (Signed)
05/06/2015 8:55 AM   Bruce Hester 12-17-1955 [id number]  Referring provider: Idelle Crouch, MD [facility] [place], [place] [address]  Chief Complaint  Patient presents with  . Routine Post Op    HPI:  60 year old male who underwent right spermatocelectomy on 04/19/2015 who returns today for wound check/ visit. He reports good amount of swelling and discomfort which is slowly improved over the past week or so.  He also had some slight bloody drainage from the midline incision but this is also resolved over the past few days. He is feeling much better overall.  Fevers or chills. No redness or further drainage. Pain is minimal.  Surgical pathology consistent with spermatocele.  He has been out of work since surgery and requests a return to work note today.   PMH: Past Medical History  Diagnosis Date  . Hypertension   . Umbilical hernia   . BPH (benign prostatic hypertrophy)   . Hypertriglyceridemia   . Bronchitis     Surgical History: Past Surgical History  Procedure Laterality Date  . Hernia repair  123456    umbilical  . Spermatocelectomy Right 04/19/2015    Procedure: SPERMATOCELECTOMY;  Surgeon: Hollice Espy, MD;  Location: [facility] ORS;  Service: Urology;  Laterality: Right;  LEFT SCROTAL EXPLORATION    Home Medications:    Medication List       This list is accurate as of: 05/06/15  8:55 AM.  Always use your most recent med list.               albuterol 108 (90 Base) MCG/ACT inhaler  Commonly known as:  PROVENTIL HFA;VENTOLIN HFA  Inhale 2 puffs into the lungs every 4 (four) hours as needed for wheezing or shortness of breath.     aspirin EC 81 MG tablet  Take 81 mg by mouth daily. Reported on 05/06/2015     DHA-EPA-VITAMIN E PO  Take 1 tablet by mouth daily. Reported on 05/06/2015     FISH OIL PO  Take 1 tablet by mouth daily. Reported on 05/06/2015     metoprolol succinate 25 MG 24 hr tablet  Commonly known as:   TOPROL-XL  Take 0.5 tablets by mouth at bedtime.        Allergies:  Allergies  Allergen Reactions  . Ibuprofen Rash    High doses    Family History: Family History  Problem Relation Age of Onset  . Kidney disease Neg Hx   . Prostate cancer Neg Hx   . Cancer Neg Hx   . Heart disease Father   . Breast cancer Mother     Social History:  reports that he has never smoked. He has never used smokeless tobacco. He reports that he does not drink alcohol or use illicit drugs.  ROS: UROLOGY Frequent Urination?: No Hard to postpone urination?: No Burning/pain with urination?: No Get up at night to urinate?: No Leakage of urine?: No Urine stream starts and stops?: No Trouble starting stream?: No Do you have to strain to urinate?: No Blood in urine?: No Urinary tract infection?: No Sexually transmitted disease?: No Injury to kidneys or bladder?: No Painful intercourse?: No Weak stream?: No Erection problems?: No Penile pain?: No  Gastrointestinal Vomiting?: No Indigestion/heartburn?: No Diarrhea?: No Constipation?: No  Constitutional Fever: No Night sweats?: No Weight loss?: No Fatigue?: No  Skin Skin rash/lesions?: No Itching?: No  Eyes Blurred vision?: No Double vision?: No  Ears/Nose/Throat Sore throat?: No Sinus problems?:  No  Hematologic/Lymphatic Swollen glands?: No Easy bruising?: No  Cardiovascular Leg swelling?: No Chest pain?: No  Respiratory Cough?: No Shortness of breath?: No  Endocrine Excessive thirst?: No  Musculoskeletal Back pain?: No Joint pain?: No  Neurological Headaches?: No Dizziness?: No  Psychologic Depression?: No Anxiety?: No  Physical Exam: BP 157/105 mmHg  Pulse 68  Ht 6\' 1"  (1.854 m)  Wt 232 lb 11.2 oz (105.552 kg)  BMI 30.71 kg/m2  Constitutional:  Alert and oriented, No acute distress. HEENT: [place] AT, moist mucus membranes.  Trachea midline, no masses. Cardiovascular: No clubbing, cyanosis, or  edema. Respiratory: Normal respiratory effort, no increased work of breathing. GI: Abdomen is soft, nontender, nondistended, no abdominal masses GU: No CVA tenderness.  Midline incision, healing well.. No surrounding erythema or drainage from the wound. Mild bilateral scrotal edema but no significant hematoma or scrotal process. Skin: No rashes, bruises or suspicious lesions. Neurologic: Grossly intact, no focal deficits, moving all 4 extremities. Psychiatric: Normal mood and affect.  Laboratory Data: Lab Results  Component Value Date   WBC 6.4 03/24/2015   HCT 44.6 03/24/2015   MCV 89 03/24/2015   PLT 222 03/24/2015    Lab Results  Component Value Date   CREATININE 0.99 03/24/2015    Assessment & Plan:    1. Spermatocele of epididymis, single Status post uncomplicated right spermatocelectomy. Wound check today appears to be healing nicely. Recommend continuation of scrotal support.  Cleared for return to work.  Follow up as needed.   Hollice Espy, MD  [facility] [address] [place], [place] [address] [phone]

## 2015-05-13 ENCOUNTER — Encounter: Payer: Self-pay | Admitting: Radiology

## 2015-05-17 ENCOUNTER — Encounter: Payer: 59 | Admitting: Urology

## 2015-11-04 ENCOUNTER — Ambulatory Visit (INDEPENDENT_AMBULATORY_CARE_PROVIDER_SITE_OTHER): Payer: 59 | Admitting: Urology

## 2015-11-04 ENCOUNTER — Encounter: Payer: Self-pay | Admitting: Urology

## 2015-11-04 VITALS — BP 138/83 | HR 90 | Ht 73.0 in | Wt 238.9 lb

## 2015-11-04 DIAGNOSIS — K4090 Unilateral inguinal hernia, without obstruction or gangrene, not specified as recurrent: Secondary | ICD-10-CM | POA: Diagnosis not present

## 2015-11-04 DIAGNOSIS — N5089 Other specified disorders of the male genital organs: Secondary | ICD-10-CM

## 2015-11-04 NOTE — Progress Notes (Signed)
11/04/2015 11:56 AM   Bruce Hester 13-Aug-1955 [id number]  Referring provider: Idelle Crouch, MD [place] [facility] [place], [place] [address]  Chief Complaint  Patient presents with  . Groin Swelling    left side    HPI:  60 year old male who underwent right spermatocelectomy on 04/19/2015 who returns today with left scrotal swelling.  He reports that after the surgery, his right-sided scrotal swelling resolved completely following spermatocelectomy. At the time of surgery, I did explore the left side due to presence of fullness but only appreciated a small lipoma of the cord. No obvious hydrocele or spermatocele was identified.  Tunica vaginalis was not opened.    For the past 1-2 months, he developed left sided scrotal and inguinal enlargement. He's been wearing loose shorts as a result of this. He denies any associated pain, nausea, vomiting, or any other significant symptoms. He has no urinary complaints today.  He does have a personal history of hernia status post umbilical hernia repair by Dr. Bary Castilla in 2014.   PMH: Past Medical History:  Diagnosis Date  . BPH (benign prostatic hypertrophy)   . Bronchitis   . Hypertension   . Hypertriglyceridemia   . Umbilical hernia     Surgical History: Past Surgical History:  Procedure Laterality Date  . HERNIA REPAIR  123456   umbilical  . SPERMATOCELECTOMY Right 04/19/2015   Procedure: SPERMATOCELECTOMY;  Surgeon: Hollice Espy, MD;  Location: [facility] ORS;  Service: Urology;  Laterality: Right;  LEFT SCROTAL EXPLORATION    Home Medications:    Medication List       Accurate as of 11/04/15 11:56 AM. Always use your most recent med list.          albuterol 108 (90 Base) MCG/ACT inhaler Commonly known as:  PROVENTIL HFA;VENTOLIN HFA Inhale 2 puffs into the lungs every 4 (four) hours as needed for wheezing or shortness of breath.   aspirin EC 81 MG tablet Take 81 mg by mouth daily. Reported on  05/06/2015   DHA-EPA-VITAMIN E PO Take 1 tablet by mouth daily. Reported on 05/06/2015   FISH OIL PO Take 1 tablet by mouth daily. Reported on 05/06/2015   metoprolol succinate 25 MG 24 hr tablet Commonly known as:  TOPROL-XL Take 0.5 tablets by mouth at bedtime.       Allergies:  Allergies  Allergen Reactions  . Ibuprofen Rash    High doses    Family History: Family History  Problem Relation Age of Onset  . Kidney disease Neg Hx   . Prostate cancer Neg Hx   . Cancer Neg Hx   . Heart disease Father   . Breast cancer Mother     Social History:  reports that he has never smoked. He has never used smokeless tobacco. He reports that he does not drink alcohol or use drugs.  ROS: UROLOGY Frequent Urination?: No Hard to postpone urination?: No Burning/pain with urination?: No Get up at night to urinate?: No Leakage of urine?: No Urine stream starts and stops?: No Trouble starting stream?: No Do you have to strain to urinate?: No Blood in urine?: No Urinary tract infection?: No Sexually transmitted disease?: No Injury to kidneys or bladder?: No Painful intercourse?: No Weak stream?: No Erection problems?: No Penile pain?: No  Gastrointestinal Nausea?: No Vomiting?: No Indigestion/heartburn?: No Diarrhea?: No Constipation?: No  Constitutional Fever: No Night sweats?: No Weight loss?: No Fatigue?: No  Skin Skin rash/lesions?: No Itching?: No  Eyes Blurred  vision?: No Double vision?: No  Ears/Nose/Throat Sore throat?: No Sinus problems?: No  Hematologic/Lymphatic Swollen glands?: No Easy bruising?: No  Cardiovascular Leg swelling?: No Chest pain?: No  Respiratory Cough?: No Shortness of breath?: No  Endocrine Excessive thirst?: No  Musculoskeletal Back pain?: No Joint pain?: Yes  Neurological Headaches?: No Dizziness?: No  Psychologic Depression?: No Anxiety?: No  Physical Exam: BP 138/83 (BP Location: Left Arm, Patient  Position: Sitting, Cuff Size: Normal)   Pulse 90   Ht 6\' 1"  (1.854 m)   Wt 238 lb 14.4 oz (108.4 kg)   BMI 31.52 kg/m   Constitutional:  Alert and oriented, No acute distress. HEENT: [place] AT, moist mucus membranes.  Trachea midline, no masses. Cardiovascular: No clubbing, cyanosis, or edema. Respiratory: Normal respiratory effort, no increased work of breathing. GI: Abdomen is soft, nontender, nondistended, no abdominal masses.  Obese. Large suprapubic fat pad. GU: Circumcised phallus. Right hemiscrotum normal with normal easily palpable right testicle.  Significant fullness within the left inguinal region extending down into the left hemiscrotum highly concerning for left inguinal hernia which was not reducible today. The left testicle is palpable with possible very small insignificant hydrocele.   Skin: No rashes, bruises or suspicious lesions. Neurologic: Grossly intact, no focal deficits, moving all 4 extremities. Psychiatric: Normal mood and affect.  Laboratory Data: Lab Results  Component Value Date   WBC 6.4 03/24/2015   HCT 44.6 03/24/2015   MCV 89 03/24/2015   PLT 222 03/24/2015    Lab Results  Component Value Date   CREATININE 0.99 03/24/2015    Assessment & Plan:    1. Scrotal swelling Based on exam, I do suspect a left inguinal hernia. Exam is somewhat difficult given his habitus. Commended scrotal ultrasound to rule out any scrotal pathology although given recent exploration, I do not suspect this. Will call with scrotal ultrasound results Right spermatocelectomy successful.  - [place] Art/Ven Flow Abd Pelv Doppler; Future - US Scrotum; Future - Ambulatory referral to General Surgery  2. Left inguinal hernia As above, suspected left inguinal hernia Referral to general surgery, Dr. Bary Castilla who has operated on the patient in the past   Follow up as needed.   Hollice Espy, MD  [facility] [address] [place], [place]  [address] [phone]

## 2015-11-07 ENCOUNTER — Encounter: Payer: Self-pay | Admitting: *Deleted

## 2015-11-11 ENCOUNTER — Telehealth: Payer: Self-pay | Admitting: Urology

## 2015-11-11 ENCOUNTER — Ambulatory Visit
Admission: RE | Admit: 2015-11-11 | Discharge: 2015-11-11 | Disposition: A | Discharge status: Home / Self Care | Payer: Managed Care, Other (non HMO) | Source: Ambulatory Visit | Attending: Urology | Admitting: Urology

## 2015-11-11 DIAGNOSIS — N5089 Other specified disorders of the male genital organs: Secondary | ICD-10-CM | POA: Diagnosis present

## 2015-11-11 DIAGNOSIS — I861 Scrotal varices: Secondary | ICD-10-CM | POA: Diagnosis not present

## 2015-11-11 DIAGNOSIS — K4090 Unilateral inguinal hernia, without obstruction or gangrene, not specified as recurrent: Secondary | ICD-10-CM | POA: Diagnosis not present

## 2015-11-24 ENCOUNTER — Ambulatory Visit (INDEPENDENT_AMBULATORY_CARE_PROVIDER_SITE_OTHER): Payer: Managed Care, Other (non HMO) | Admitting: General Surgery

## 2015-11-24 ENCOUNTER — Encounter: Payer: Self-pay | Admitting: General Surgery

## 2015-11-24 VITALS — BP 130/74 | HR 74 | Resp 12 | Ht 73.0 in | Wt 235.0 lb

## 2015-11-24 DIAGNOSIS — K4090 Unilateral inguinal hernia, without obstruction or gangrene, not specified as recurrent: Secondary | ICD-10-CM | POA: Diagnosis not present

## 2015-11-24 NOTE — Patient Instructions (Addendum)

## 2015-11-24 NOTE — H&P (Signed)
Patient ID: Bruce Hester, male   DOB: December 04, 1955, 60 y.o.   MRN: [id number]  Chief Complaint  Patient presents with  . Other    left inguinal hernia    HPI Bruce Hester is a 60 y.o. male here today for a evaluation of a left inguinal hernia . Patient states he noticed this around January after his spermatocelectomy.He states the area is getting bigger,little pain with activity.  HPI  Past Medical History:  Diagnosis Date  . BPH (benign prostatic hypertrophy)   . Bronchitis   . Hypertension   . Hypertriglyceridemia   . Umbilical hernia     Past Surgical History:  Procedure Laterality Date  . COLONOSCOPY  2015  . HERNIA REPAIR  123456   umbilical  . SPERMATOCELECTOMY Right 04/19/2015   Procedure: SPERMATOCELECTOMY;  Surgeon: Hollice Espy, MD;  Location: [facility] ORS;  Service: Urology;  Laterality: Right;  LEFT SCROTAL EXPLORATION    Family History  Problem Relation Age of Onset  . Heart disease Father   . Breast cancer Mother   . Kidney disease Neg Hx   . Prostate cancer Neg Hx   . Cancer Neg Hx     Social History Social History  Substance Use Topics  . Smoking status: Never Smoker  . Smokeless tobacco: Never Used  . Alcohol use No    Allergies  Allergen Reactions  . Ibuprofen Rash    High doses    Current Outpatient Prescriptions  Medication Sig Dispense Refill  . aspirin EC 81 MG tablet Take 81 mg by mouth daily. Reported on 05/06/2015    . DHA-EPA-VITAMIN E PO Take 1 tablet by mouth daily. Reported on 05/06/2015    . metoprolol succinate (TOPROL-XL) 25 MG 24 hr tablet Take 0.5 tablets by mouth at bedtime.     . Omega-3 Fatty Acids (FISH OIL PO) Take 1 tablet by mouth daily. Reported on 05/06/2015     No current facility-administered medications for this visit.     Review of Systems Review of Systems  Constitutional: Negative.   Respiratory: Negative.   Cardiovascular: Negative.     Blood pressure 130/74, pulse 74, resp. rate 12, height 6\' 1"  (1.854  m), weight 235 lb (106.6 kg).  Physical Exam Physical Exam  Constitutional: He is oriented to person, place, and time. He appears well-developed and well-nourished.  Eyes: Conjunctivae are normal. No scleral icterus.  Neck: Neck supple.  Cardiovascular: Normal rate, regular rhythm and normal heart sounds.   Pulmonary/Chest: Effort normal and breath sounds normal.  Abdominal: Soft. Normal appearance and bowel sounds are normal. There is no hepatomegaly. There is no tenderness.  Genitourinary:     Lymphadenopathy:    He has no cervical adenopathy.  Neurological: He is alert and oriented to person, place, and time.  Skin: Skin is warm and dry.    Data Reviewed April 19, 2015 operative report from scrotal exploration: Spermatocele on the right excised, thickened left cord with lipoma. No intervention.  Assessment    Possible left inguinal hernia versus lipoma of the cord, symptomatic.    Plan    In light of his January 2017 scrotal exploration, its reasonable to perform a groin exploration and determine if her hernia is present or if this is simply a large lipoma of the cord. If her hernia is present, the role of mesh repair was reviewed. The patient is aware that this may not represent hernia, but as he is symptomatic in this side exploration is reasonable.  Hernia precautions and incarceration were discussed with the patient. If they develop symptoms of an incarcerated hernia, they were encouraged to seek prompt medical attention.  I have recommended repair of the hernia if identified during the procedure using mesh. The risk of infection was reviewed.      Patient to be scheduled  The patient is scheduled at [facility] for surgery on 12/20/15. He will pre admit by phone. The patient is aware of date and instructions.    This information has been scribed by Gaspar Cola CMA.    Robert Bellow 11/26/2015, 6:14 AM

## 2015-11-26 ENCOUNTER — Other Ambulatory Visit: Payer: Self-pay | Admitting: General Surgery

## 2015-11-26 DIAGNOSIS — N5089 Other specified disorders of the male genital organs: Secondary | ICD-10-CM

## 2015-12-09 ENCOUNTER — Encounter
Admission: RE | Admit: 2015-12-09 | Discharge: 2015-12-09 | Disposition: A | Discharge status: Home / Self Care | Payer: Managed Care, Other (non HMO) | Source: Ambulatory Visit | Attending: General Surgery | Admitting: General Surgery

## 2015-12-09 HISTORY — DX: Malignant (primary) neoplasm, unspecified: C80.1

## 2015-12-09 HISTORY — DX: Pneumonia, unspecified organism: J18.9

## 2015-12-09 NOTE — Patient Instructions (Signed)
  Your procedure is scheduled on: 12-20-15 Report to Same Day Surgery 2nd floor medical mall To find out your arrival time please call [phone] between 1PM - 3PM on 12-19-15  Remember: Instructions that are not followed completely may result in serious medical risk, up to and including death, or upon the discretion of your surgeon and anesthesiologist your surgery may need to be rescheduled.    _x___ 1. Do not eat food or drink liquids after midnight. No gum chewing or hard candies.     __x__ 2. No Alcohol for 24 hours before or after surgery.   __x__3. No Smoking for 24 prior to surgery.   ____  4. Bring all medications with you on the day of surgery if instructed.    __x__ 5. Notify your doctor if there is any change in your medical condition     (cold, fever, infections).     Do not wear jewelry, make-up, hairpins, clips or nail polish.  Do not wear lotions, powders, or perfumes. You may wear deodorant.  Do not shave 48 hours prior to surgery. Men may shave face and neck.  Do not bring valuables to the hospital.    [facility] is not responsible for any belongings or valuables.               Contacts, dentures or bridgework may not be worn into surgery.  Leave your suitcase in the car. After surgery it may be brought to your room.  For patients admitted to the hospital, discharge time is determined by your treatment team.   Patients discharged the day of surgery will not be allowed to drive home.    Please read over the following fact sheets that you were given:   [facility] Preparing for Surgery and or MRSA Information   ____ Take these medicines the morning of surgery with A SIP OF WATER:    1. NONE  2.  3.  4.  5.  6.  ____ Fleet Enema (as directed)   ___ Use CHG Soap or sage wipes as directed on instruction sheet   ____ Use inhalers on the day of surgery and bring to hospital day of surgery  ____ Stop metformin 2 days prior to surgery    ____ Take 1/2 of  usual insulin dose the night before surgery and none on the morning of           surgery.   _X___ Stop aspirin or coumadin, or plavix-OK TO CONTINUE ASA- DO NOT TAKE AM OF SURGERY  ___ Stop Anti-inflammatories such as Advil, Aleve, Ibuprofen, Motrin, Naproxen,          Naprosyn, Goodies powders or aspirin products. Ok to take Tylenol.   _X___ Stop supplements until after surgery-STOP VIT E AND FISH OIL 7 DAYS PRIOR TO SURGERY  ____ Bring C-Pap to the hospital.

## 2015-12-20 ENCOUNTER — Encounter: Payer: Self-pay | Admitting: General Surgery

## 2015-12-20 ENCOUNTER — Encounter
Admission: RE | Disposition: A | Discharge status: Home / Self Care | Payer: Self-pay | Source: Ambulatory Visit | Attending: General Surgery

## 2015-12-20 ENCOUNTER — Ambulatory Visit
Admission: RE | Admit: 2015-12-20 | Discharge: 2015-12-20 | Disposition: A | Discharge status: Home / Self Care | Payer: Managed Care, Other (non HMO) | Source: Ambulatory Visit | Attending: General Surgery | Admitting: General Surgery

## 2015-12-20 ENCOUNTER — Ambulatory Visit: Payer: Managed Care, Other (non HMO) | Admitting: Anesthesiology

## 2015-12-20 DIAGNOSIS — J40 Bronchitis, not specified as acute or chronic: Secondary | ICD-10-CM | POA: Insufficient documentation

## 2015-12-20 DIAGNOSIS — Z803 Family history of malignant neoplasm of breast: Secondary | ICD-10-CM | POA: Diagnosis not present

## 2015-12-20 DIAGNOSIS — N5089 Other specified disorders of the male genital organs: Secondary | ICD-10-CM

## 2015-12-20 DIAGNOSIS — Z8249 Family history of ischemic heart disease and other diseases of the circulatory system: Secondary | ICD-10-CM | POA: Insufficient documentation

## 2015-12-20 DIAGNOSIS — Z79899 Other long term (current) drug therapy: Secondary | ICD-10-CM | POA: Insufficient documentation

## 2015-12-20 DIAGNOSIS — Z886 Allergy status to analgesic agent status: Secondary | ICD-10-CM | POA: Insufficient documentation

## 2015-12-20 DIAGNOSIS — E781 Pure hyperglyceridemia: Secondary | ICD-10-CM | POA: Diagnosis not present

## 2015-12-20 DIAGNOSIS — Z7982 Long term (current) use of aspirin: Secondary | ICD-10-CM | POA: Insufficient documentation

## 2015-12-20 DIAGNOSIS — I10 Essential (primary) hypertension: Secondary | ICD-10-CM | POA: Diagnosis not present

## 2015-12-20 DIAGNOSIS — N400 Enlarged prostate without lower urinary tract symptoms: Secondary | ICD-10-CM | POA: Diagnosis not present

## 2015-12-20 DIAGNOSIS — K4090 Unilateral inguinal hernia, without obstruction or gangrene, not specified as recurrent: Secondary | ICD-10-CM | POA: Diagnosis not present

## 2015-12-20 HISTORY — PX: INGUINAL HERNIA REPAIR: SHX194

## 2015-12-20 SURGERY — REPAIR, HERNIA, INGUINAL, ADULT
Anesthesia: General | Laterality: Left | Wound class: Clean

## 2015-12-20 MED ORDER — FAMOTIDINE 20 MG PO TABS
20.0000 mg | ORAL_TABLET | Freq: Once | ORAL | Status: AC
  Administered 2015-12-20: 20 mg via ORAL

## 2015-12-20 MED ORDER — FAMOTIDINE 20 MG PO TABS
ORAL_TABLET | ORAL | Status: AC
  Administered 2015-12-20: 20 mg via ORAL
  Filled 2015-12-20: qty 1

## 2015-12-20 MED ORDER — FENTANYL CITRATE (PF) 100 MCG/2ML IJ SOLN
INTRAMUSCULAR | Status: DC | PRN
  Administered 2015-12-20: 75 ug via INTRAVENOUS
  Administered 2015-12-20: 25 ug via INTRAVENOUS

## 2015-12-20 MED ORDER — MIDAZOLAM HCL 2 MG/2ML IJ SOLN
INTRAMUSCULAR | Status: DC | PRN
  Administered 2015-12-20: 2 mg via INTRAVENOUS

## 2015-12-20 MED ORDER — CEFAZOLIN SODIUM-DEXTROSE 2-4 GM/100ML-% IV SOLN
2.0000 g | INTRAVENOUS | Status: AC
  Administered 2015-12-20: 2 g via INTRAVENOUS

## 2015-12-20 MED ORDER — CEFAZOLIN SODIUM-DEXTROSE 2-4 GM/100ML-% IV SOLN
INTRAVENOUS | Status: AC
  Administered 2015-12-20: 2 g via INTRAVENOUS
  Filled 2015-12-20: qty 100

## 2015-12-20 MED ORDER — LIDOCAINE HCL (PF) 1 % IJ SOLN
INTRAMUSCULAR | Status: AC
  Filled 2015-12-20: qty 2

## 2015-12-20 MED ORDER — BUPIVACAINE-EPINEPHRINE (PF) 0.5% -1:200000 IJ SOLN
INTRAMUSCULAR | Status: AC
  Filled 2015-12-20: qty 30

## 2015-12-20 MED ORDER — ONDANSETRON HCL 4 MG/2ML IJ SOLN
INTRAMUSCULAR | Status: DC | PRN
  Administered 2015-12-20: 4 mg via INTRAVENOUS

## 2015-12-20 MED ORDER — EPHEDRINE SULFATE 50 MG/ML IJ SOLN
INTRAMUSCULAR | Status: DC | PRN
  Administered 2015-12-20 (×2): 10 mg via INTRAVENOUS
  Administered 2015-12-20: 5 mg via INTRAVENOUS
  Administered 2015-12-20: 10 mg via INTRAVENOUS
  Administered 2015-12-20: 15 mg via INTRAVENOUS
  Administered 2015-12-20: 5 mg via INTRAVENOUS

## 2015-12-20 MED ORDER — PROPOFOL 10 MG/ML IV BOLUS
INTRAVENOUS | Status: DC | PRN
  Administered 2015-12-20: 200 mg via INTRAVENOUS

## 2015-12-20 MED ORDER — HYDROCODONE-ACETAMINOPHEN 5-325 MG PO TABS: 1.0000 | ORAL_TABLET | ORAL | 0 refills | Status: DC | PRN

## 2015-12-20 MED ORDER — BUPIVACAINE-EPINEPHRINE (PF) 0.5% -1:200000 IJ SOLN
INTRAMUSCULAR | Status: DC | PRN
  Administered 2015-12-20: 30 mL

## 2015-12-20 MED ORDER — LACTATED RINGERS IV SOLN
INTRAVENOUS | Status: DC
  Administered 2015-12-20: 06:00:00 via INTRAVENOUS

## 2015-12-20 MED ORDER — DEXAMETHASONE SODIUM PHOSPHATE 10 MG/ML IJ SOLN
INTRAMUSCULAR | Status: DC | PRN
  Administered 2015-12-20: 8 mg via INTRAVENOUS

## 2015-12-20 MED ORDER — LIDOCAINE HCL (CARDIAC) 20 MG/ML IV SOLN
INTRAVENOUS | Status: DC | PRN
  Administered 2015-12-20: 100 mg via INTRAVENOUS

## 2015-12-20 SURGICAL SUPPLY — 35 items
BLADE SURG 15 STRL SS SAFETY (BLADE) ×6 IMPLANT
CANISTER SUCT 1200ML W/VALVE (MISCELLANEOUS) ×3 IMPLANT
CHLORAPREP W/TINT 26ML (MISCELLANEOUS) ×3 IMPLANT
CLOSURE WOUND 1/2 X4 (GAUZE/BANDAGES/DRESSINGS) ×1
DECANTER SPIKE VIAL GLASS SM (MISCELLANEOUS) ×3 IMPLANT
DRAIN PENROSE 1/4X12 LTX (DRAIN) ×3 IMPLANT
DRAPE LAPAROTOMY 100X77 ABD (DRAPES) ×3 IMPLANT
DRESSING TELFA 4X3 1S ST N-ADH (GAUZE/BANDAGES/DRESSINGS) ×3 IMPLANT
DRSG TEGADERM 4X4.75 (GAUZE/BANDAGES/DRESSINGS) ×3 IMPLANT
ELECT REM PT RETURN 9FT ADLT (ELECTROSURGICAL) ×3
ELECTRODE REM PT RTRN 9FT ADLT (ELECTROSURGICAL) ×1 IMPLANT
GLOVE BIO SURGEON STRL SZ7.5 (GLOVE) ×9 IMPLANT
GLOVE INDICATOR 8.0 STRL GRN (GLOVE) ×6 IMPLANT
GOWN STRL REUS W/ TWL LRG LVL3 (GOWN DISPOSABLE) ×2 IMPLANT
GOWN STRL REUS W/TWL LRG LVL3 (GOWN DISPOSABLE) ×4
KIT RM TURNOVER STRD PROC AR (KITS) ×3 IMPLANT
LABEL OR SOLS (LABEL) ×3 IMPLANT
MESH MARLEX PLUG MEDIUM (Mesh General) ×3 IMPLANT
NDL SAFETY 22GX1.5 (NEEDLE) ×6 IMPLANT
NEEDLE HYPO 25X1 1.5 SAFETY (NEEDLE) IMPLANT
PACK BASIN MINOR ARMC (MISCELLANEOUS) ×3 IMPLANT
PENCIL ELECTRO HAND CTR (MISCELLANEOUS) ×3 IMPLANT
STRIP CLOSURE SKIN 1/2X4 (GAUZE/BANDAGES/DRESSINGS) ×2 IMPLANT
SUT SURGILON 0 BLK (SUTURE) ×3 IMPLANT
SUT VIC AB 2-0 SH 27 (SUTURE) ×2
SUT VIC AB 2-0 SH 27XBRD (SUTURE) ×1 IMPLANT
SUT VIC AB 3-0 54X BRD REEL (SUTURE) ×1 IMPLANT
SUT VIC AB 3-0 BRD 54 (SUTURE) ×2
SUT VIC AB 3-0 SH 27 (SUTURE) ×2
SUT VIC AB 3-0 SH 27X BRD (SUTURE) ×1 IMPLANT
SUT VIC AB 4-0 FS2 27 (SUTURE) ×3 IMPLANT
SUT VICRYL+ 3-0 144IN (SUTURE) ×3 IMPLANT
SWABSTK COMLB BENZOIN TINCTURE (MISCELLANEOUS) ×3 IMPLANT
SYR 3ML LL SCALE MARK (SYRINGE) ×3 IMPLANT
SYR CONTROL 10ML (SYRINGE) ×3 IMPLANT

## 2015-12-20 NOTE — Anesthesia Preprocedure Evaluation (Signed)
Anesthesia Evaluation  Patient identified by MRN, date of birth, ID band Patient awake    Reviewed: Allergy & Precautions, NPO status , Patient's Chart, lab work & pertinent test results  History of Anesthesia Complications Negative for: history of anesthetic complications  Airway Mallampati: II       Dental   Pulmonary neg pulmonary ROS,           Cardiovascular hypertension, Pt. on medications and Pt. on home beta blockers      Neuro/Psych negative neurological ROS     GI/Hepatic negative GI ROS, Neg liver ROS,   Endo/Other  negative endocrine ROS  Renal/GU negative Renal ROS     Musculoskeletal   Abdominal   Peds  Hematology negative hematology ROS (+)   Anesthesia Other Findings   Reproductive/Obstetrics                             Anesthesia Physical Anesthesia Plan  ASA: II  Anesthesia Plan: General   Post-op Pain Management:    Induction: Intravenous  Airway Management Planned: LMA  Additional Equipment:   Intra-op Plan:   Post-operative Plan:   Informed Consent: I have reviewed the patients History and Physical, chart, labs and discussed the procedure including the risks, benefits and alternatives for the proposed anesthesia with the patient or authorized representative who has indicated his/her understanding and acceptance.     Plan Discussed with:   Anesthesia Plan Comments:         Anesthesia Quick Evaluation

## 2015-12-20 NOTE — Addendum Note (Signed)
Addendum  created 12/20/15 1132 by Darlyne Russian, CRNA   Anesthesia Intra Flowsheets edited

## 2015-12-20 NOTE — Discharge Instructions (Signed)
AMBULATORY SURGERY  °DISCHARGE INSTRUCTIONS ° ° °1) The drugs that you were given will stay in your system until tomorrow so for the next 24 hours you should not: ° °A) Drive an automobile °B) Make any legal decisions °C) Drink any alcoholic beverage ° ° °2) You may resume regular meals tomorrow.  Today it is better to start with liquids and gradually work up to solid foods. ° °You may eat anything you prefer, but it is better to start with liquids, then soup and crackers, and gradually work up to solid foods. ° ° °3) Please notify your doctor immediately if you have any unusual bleeding, trouble breathing, redness and pain at the surgery site, drainage, fever, or pain not relieved by medication. ° ° ° °4) Additional Instructions: ° ° ° ° ° ° ° °Please contact your physician with any problems or Same Day Surgery at [phone], Monday through Friday 6 am to 4 pm, or [place] [facility] [place] Main number at [phone]. °

## 2015-12-20 NOTE — Anesthesia Postprocedure Evaluation (Signed)
Anesthesia Post Note  Patient: Bruce Hester  Procedure(s) Performed: Procedure(s) (LRB): HERNIA REPAIR INGUINAL ADULT (Left)  Patient location during evaluation: PACU Anesthesia Type: General Level of consciousness: awake and alert Pain management: pain level controlled Vital Signs Assessment: post-procedure vital signs reviewed and stable Respiratory status: spontaneous breathing and respiratory function stable Cardiovascular status: stable Anesthetic complications: no    Last Vitals:  Vitals:   12/20/15 0932 12/20/15 0943  BP: (!) 138/100 (!) 153/98  Pulse: 93 91  Resp: 20 20  Temp: 36.4 C 36.3 C    Last Pain:  Vitals:   12/20/15 0932  TempSrc:   PainSc: Asleep                 KEPHART,WILLIAM K

## 2015-12-20 NOTE — H&P (Signed)
No change in clinical history or exam. For left inguinal repair.

## 2015-12-20 NOTE — Anesthesia Procedure Notes (Signed)
Procedure Name: LMA Insertion Date/Time: 12/20/2015 7:23 AM Performed by: Darlyne Russian Pre-anesthesia Checklist: Patient identified, Emergency Drugs available, Suction available and Patient being monitored Patient Re-evaluated:Patient Re-evaluated prior to inductionOxygen Delivery Method: Circle system utilized Preoxygenation: Pre-oxygenation with 100% oxygen Intubation Type: IV induction Ventilation: Mask ventilation without difficulty LMA: LMA inserted LMA Size: 4.0 Number of attempts: 1 Dental Injury: Teeth and Oropharynx as per pre-operative assessment

## 2015-12-20 NOTE — Op Note (Signed)
Preoperative diagnosis: Left inguinal hernia.  Postoperative diagnosis same with large volume of entrapped omentum.  Operative procedure: Left inguinal hernia repair with medium Bard PerFix plug and patch.  Operating surgeon: Ollen Bowl, M.D.  Anesthesia: Gen. by LMA, Marcaine 0.5% with 1-200,000 epinephrine, 30 mL; Toradol 30 g.  Estimated blood loss: Less than 5 mL.  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 He was admitted from Dr. Elly Modena. The role for prosthetic mesh was reviewed. Hair was removed from the surgical site with clippers in the holding area. He received Kefzol intravenously for prophylaxis. SCD stockings for DVT (.  Operative note: With the patient under adequate general anesthesia the lower abdomen was cleansed with ChloraPrep and draped. A 5 cm skin line incision was made along the anticipated course of the inguinal canal after field block anesthesia was placed. Skin was incised sharply and the remaining dissection completed electrocautery. 3-0 Vicryl ties were used for hemostasis. The attenuated external Bleich fibers were opened in direction of their orientation and the ilioinguinal and iliohypogastric nerves identified and protected. There was a large volume of omental tissue in the hernia sac and this was dissected from a generous cord with cautery dissection. The hernia sac was excised and discarded. It was elected to transect the omentum at the level of the internal ring and this was completed using 3-0 Vicryl ties for hemostasis. The weight of the resected omental tissue was 230 g. The fascial defect at the  internal ring was fairly small and a medium Bard PerFix plug was placed and anchored to the iliopubic track and the edge of the transverse abdominis aponeurosis with a single 0 Surgilon figure-of-eight suture. The onlay mesh was anchored to the pubic tubercle similar fashion along the inguinal ligament  with interrupted sutures and then to the transverse abdominis aponeurosis at the medial and superior borders. The cord contents were returned to the inguinal canal and Toradol placed into the wound. The external Bleich was closed with a running 2-0 Vicryl suture. Scarpa's fascia was closed with a running 3-0 Vicryl suture. Skin was closed with a running 4-0 Vicryl subcuticular suture. Benzoin, Steri-Strips, Telfa and Tegaderm dressing was applied.  The patient tolerated the procedure well and was taken recovery room in stable condition.

## 2015-12-20 NOTE — Transfer of Care (Signed)
2Immediate Anesthesia Transfer of Care Note  Patient: Bruce Hester  Procedure(s) Performed: Procedure(s): HERNIA REPAIR INGUINAL ADULT (Left)  Patient Location: PACU  Anesthesia Type:General  Level of Consciousness: responds to stimulation  Airway & Oxygen Therapy: Patient Spontanous Breathing and Patient connected to nasal cannula oxygen  Post-op Assessment: Report given to RN and Post -op Vital signs reviewed and stable  Post vital signs: Reviewed and stable  Last Vitals:  Vitals:   12/20/15 0610 12/20/15 0859  BP: (!) 161/110 (!) 150/106  Pulse: 71 (!) 117  Resp: 16 18  Temp: 36.5 C 36.3 C    Last Pain:  Vitals:   12/20/15 0610  TempSrc: Oral         Complications: No apparent anesthesia complications

## 2015-12-21 LAB — SURGICAL PATHOLOGY

## 2015-12-25 ENCOUNTER — Encounter: Payer: Self-pay | Admitting: General Surgery

## 2015-12-27 NOTE — Telephone Encounter (Signed)
Error

## 2015-12-28 ENCOUNTER — Encounter: Payer: Self-pay | Admitting: General Surgery

## 2015-12-28 ENCOUNTER — Ambulatory Visit (INDEPENDENT_AMBULATORY_CARE_PROVIDER_SITE_OTHER): Payer: Managed Care, Other (non HMO) | Admitting: General Surgery

## 2015-12-28 VITALS — BP 158/88 | HR 86 | Resp 16 | Ht 74.0 in | Wt 235.0 lb

## 2015-12-28 DIAGNOSIS — K4090 Unilateral inguinal hernia, without obstruction or gangrene, not specified as recurrent: Secondary | ICD-10-CM

## 2015-12-28 NOTE — Patient Instructions (Addendum)
The patient is aware to call back for any questions or concerns. heating pad as needed for comfort. Follow up in 6 weeks. Proper lifting techniques reviewed. Resume activities as tolerated.

## 2015-12-28 NOTE — Progress Notes (Signed)
Patient ID: Bruce Hester, male   DOB: 23-Oct-1955, 60 y.o.   MRN: [id number]  Chief Complaint  Patient presents with  . Routine Post Op    inguinal hernia    HPI Bruce Hester is a 60 y.o. male here today for his left inguinal hernia repair done on 12/20/15. Patient states he is doing well. His bowels are regular and no bleeding. He is here today with his wife, Bruce Hester. 1/2 pound of omentum was removed from the hernia sac at the time of surgery.  HPI  Past Medical History:  Diagnosis Date  . BPH (benign prostatic hypertrophy)   . Bronchitis   . Cancer [facility])    SKIN CANCER  . Hypertension   . Hypertriglyceridemia   . Pneumonia 2015  . Umbilical hernia     Past Surgical History:  Procedure Laterality Date  . COLONOSCOPY  2015  . HERNIA REPAIR  123456   umbilical  . INGUINAL HERNIA REPAIR Left 12/20/2015   Procedure: HERNIA REPAIR INGUINAL ADULT;  Surgeon: Robert Bellow, MD;  Location: [facility] ORS;  Service: General;  Laterality: Left;  . SPERMATOCELECTOMY Right 04/19/2015   Procedure: SPERMATOCELECTOMY;  Surgeon: Hollice Espy, MD;  Location: [facility] ORS;  Service: Urology;  Laterality: Right;  LEFT SCROTAL EXPLORATION    Family History  Problem Relation Age of Onset  . Heart disease Father   . Breast cancer Mother   . Kidney disease Neg Hx   . Prostate cancer Neg Hx   . Cancer Neg Hx     Social History Social History  Substance Use Topics  . Smoking status: Never Smoker  . Smokeless tobacco: Never Used  . Alcohol use No    Allergies  Allergen Reactions  . Ibuprofen Rash    High doses    Current Outpatient Prescriptions  Medication Sig Dispense Refill  . aspirin EC 81 MG tablet Take 81 mg by mouth daily. Reported on 05/06/2015    . DHA-EPA-VITAMIN E PO Take 1 tablet by mouth daily. Reported on 05/06/2015    . metoprolol succinate (TOPROL-XL) 25 MG 24 hr tablet Take 0.5 tablets by mouth at bedtime.     . Naproxen Sodium (ALEVE PO) Take by mouth as  directed.    . Omega-3 Fatty Acids (FISH OIL PO) Take 1 tablet by mouth daily. Reported on 05/06/2015     No current facility-administered medications for this visit.     Review of Systems Review of Systems  Blood pressure (!) 158/88, pulse 86, resp. rate 16, height 6\' 2"  (1.88 m), weight 235 lb (106.6 kg).  Physical Exam Physical Exam  Constitutional: He is oriented to person, place, and time. He appears well-developed and well-nourished.  Abdominal: Soft.  Genitourinary:  Genitourinary Comments: Thickening at surgical area  Neurological: He is alert and oriented to person, place, and time.  Skin: Skin is warm and dry.  Psychiatric: His behavior is normal.       Assessment    Doing well status post hernia repair.  Moderate cord swelling secondary to massive hernia size.    Plan         The patient is aware to call back for any questions or concerns. Heating pad as needed for comfort. Follow up in 6 weeks. Proper lifting techniques reviewed. Resume activities as tolerated.   This information has been scribed by Karie Fetch RN, BSN,BC.   Robert Bellow 12/29/2015, 8:20 AM

## 2016-02-08 ENCOUNTER — Ambulatory Visit: Payer: Managed Care, Other (non HMO) | Admitting: General Surgery

## 2016-02-16 ENCOUNTER — Ambulatory Visit (INDEPENDENT_AMBULATORY_CARE_PROVIDER_SITE_OTHER): Payer: Managed Care, Other (non HMO) | Admitting: General Surgery

## 2016-02-16 ENCOUNTER — Encounter: Payer: Self-pay | Admitting: General Surgery

## 2016-02-16 VITALS — BP 122/70 | HR 74 | Resp 12 | Ht 74.0 in | Wt 230.0 lb

## 2016-02-16 DIAGNOSIS — K4090 Unilateral inguinal hernia, without obstruction or gangrene, not specified as recurrent: Secondary | ICD-10-CM

## 2016-02-16 NOTE — Patient Instructions (Addendum)
Return as needed.Proper lifting techniques reviewed. 

## 2016-02-16 NOTE — Progress Notes (Signed)
Patient ID: Bruce Hester, male   DOB: 12-22-1955, 60 y.o.   MRN: [id number]  Chief Complaint  Patient presents with  . Routine Post Op    HPI Bruce Hester is a 60 y.o. male here for a post op visit from a left inguinal hernia repair completed on 12/20/15. He reports that he is doing well.  HPI  Past Medical History:  Diagnosis Date  . BPH (benign prostatic hypertrophy)   . Bronchitis   . Cancer [facility])    SKIN CANCER  . Hypertension   . Hypertriglyceridemia   . Pneumonia 2015  . Umbilical hernia     Past Surgical History:  Procedure Laterality Date  . COLONOSCOPY  2015  . HERNIA REPAIR  123456   umbilical  . INGUINAL HERNIA REPAIR Left 12/20/2015   Procedure: HERNIA REPAIR INGUINAL ADULT;  Surgeon: Robert Bellow, MD;  Location: [facility] ORS;  Service: General;  Laterality: Left;  . SPERMATOCELECTOMY Right 04/19/2015   Procedure: SPERMATOCELECTOMY;  Surgeon: Hollice Espy, MD;  Location: [facility] ORS;  Service: Urology;  Laterality: Right;  LEFT SCROTAL EXPLORATION    Family History  Problem Relation Age of Onset  . Heart disease Father   . Breast cancer Mother   . Kidney disease Neg Hx   . Prostate cancer Neg Hx   . Cancer Neg Hx     Social History Social History  Substance Use Topics  . Smoking status: Never Smoker  . Smokeless tobacco: Never Used  . Alcohol use No    Allergies  Allergen Reactions  . Ibuprofen Rash    High doses    Current Outpatient Prescriptions  Medication Sig Dispense Refill  . aspirin EC 81 MG tablet Take 81 mg by mouth daily. Reported on 05/06/2015    . DHA-EPA-VITAMIN E PO Take 1 tablet by mouth daily. Reported on 05/06/2015    . metoprolol succinate (TOPROL-XL) 25 MG 24 hr tablet Take 0.5 tablets by mouth at bedtime.     . Naproxen Sodium (ALEVE PO) Take by mouth as directed.    . Omega-3 Fatty Acids (FISH OIL PO) Take 1 tablet by mouth daily. Reported on 05/06/2015     No current facility-administered medications for this visit.      Review of Systems Review of Systems  Constitutional: Negative.   Respiratory: Negative.   Cardiovascular: Negative.     Blood pressure 122/70, pulse 74, resp. rate 12, height 6\' 2"  (1.88 m), weight 230 lb (104.3 kg).  Physical Exam Physical Exam  Genitourinary:        Assessment    Doing well status post repair left inguinal hernia with resection of 1/2 pound of omentum.    Plan    Proper lifting techniques reviewed.     Patient to return as needed. This information has been scribed by Gaspar Cola CMA.   Robert Bellow 02/16/2016, 8:29 PM

## 2016-08-02 ENCOUNTER — Encounter: Payer: Self-pay | Admitting: General Surgery

## 2016-08-22 IMAGING — US US SCROTUM
1 series · 14 of 25 positions shown · non-contrast
Comparison: None.

CLINICAL DATA: Bilateral scrotal swelling for 2 months

EXAM:
SCROTAL ULTRASOUND
DOPPLER ULTRASOUND OF THE TESTICLES
TECHNIQUE: Complete ultrasound examination of the testicles, epididymis, and
other scrotal structures was performed. Color and spectral Doppler
ultrasound were also utilized to evaluate blood flow to the
testicles.

[Series 1: us scrotum · 0.08mm/px · 14 of 55 slices shown]
[im 1/55]
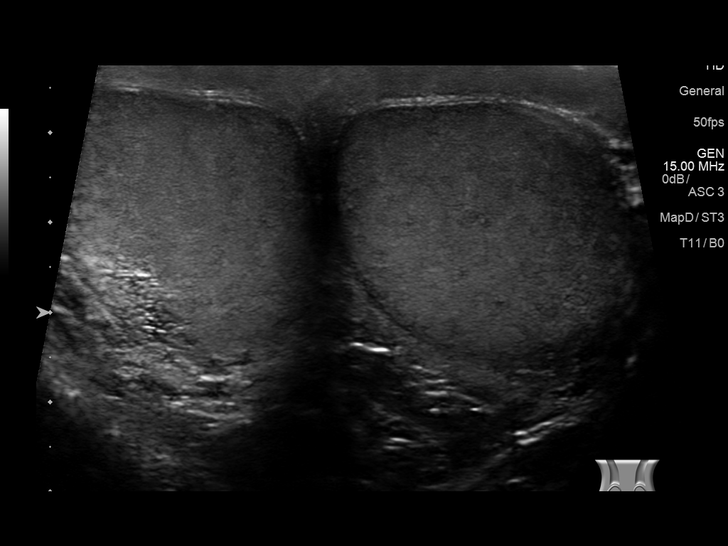
[im 5/55]
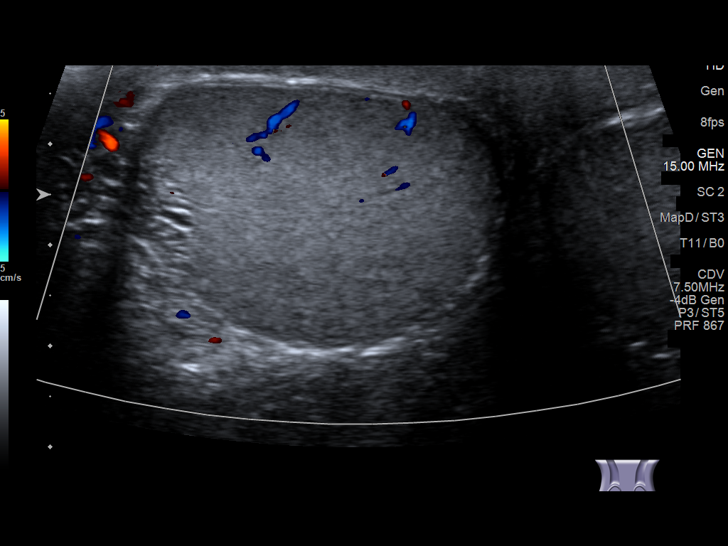
[im 10/55]
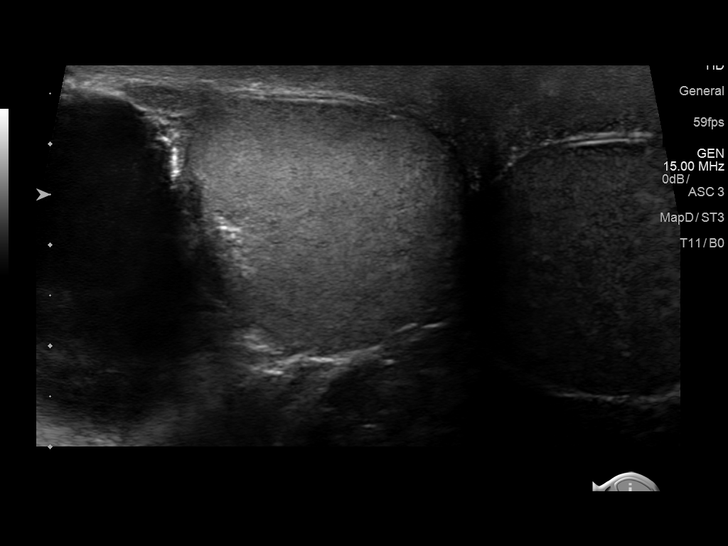
[im 14/55]
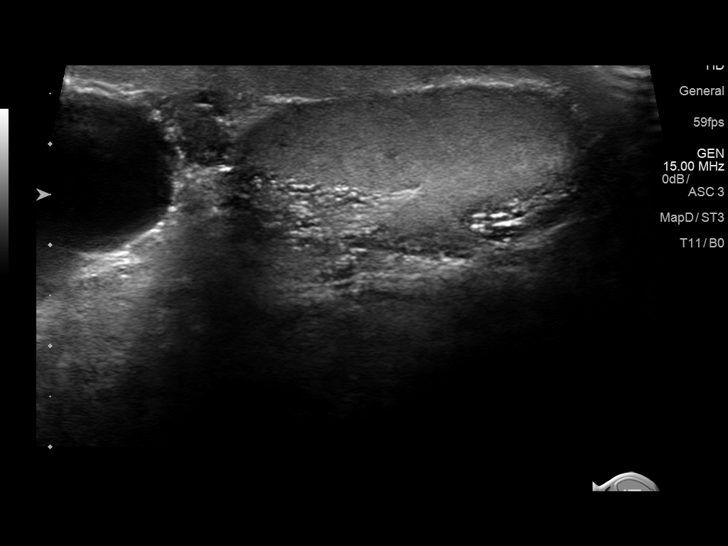
[im 19/55]
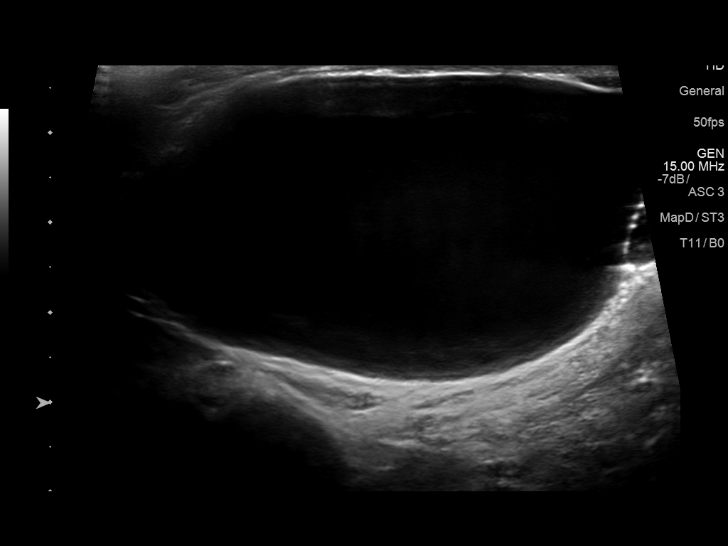
[im 21/55]
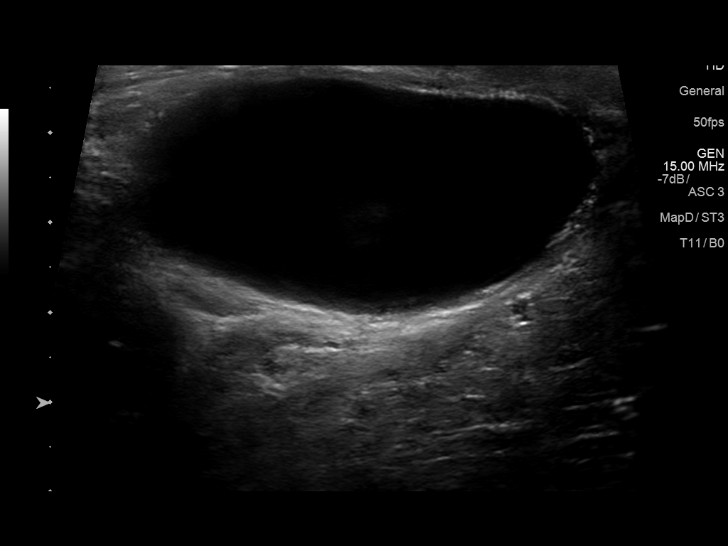
[im 25/55]
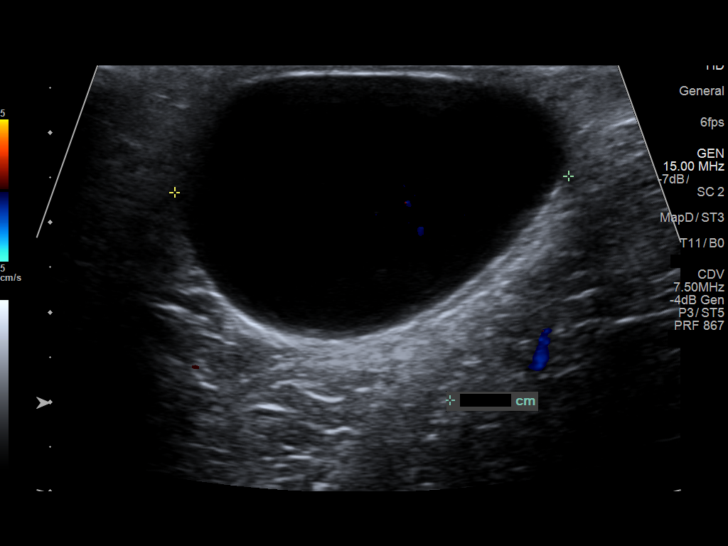
[im 30/55]
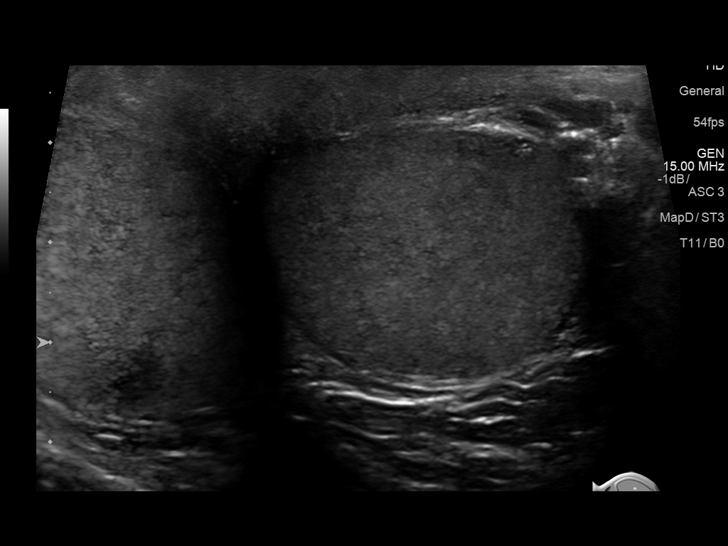
[im 34/55]
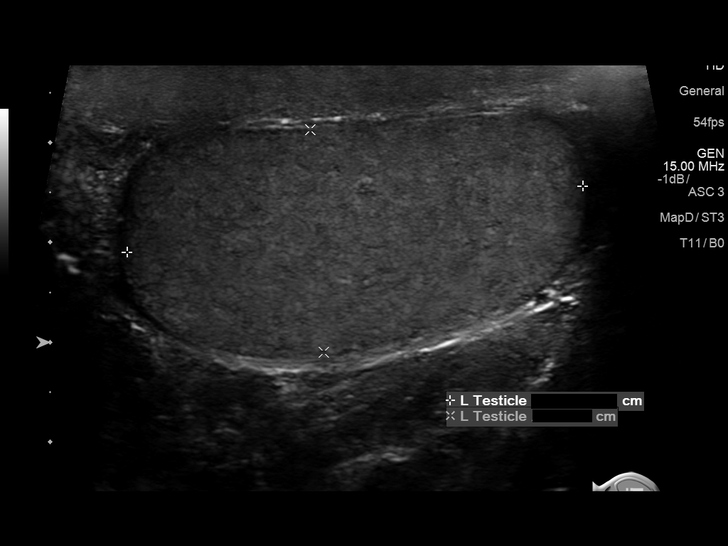
[im 37/55]
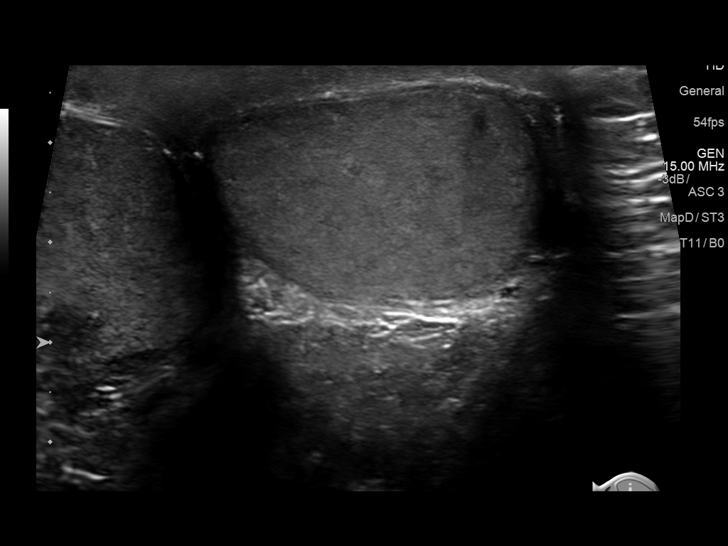
[im 41/55]
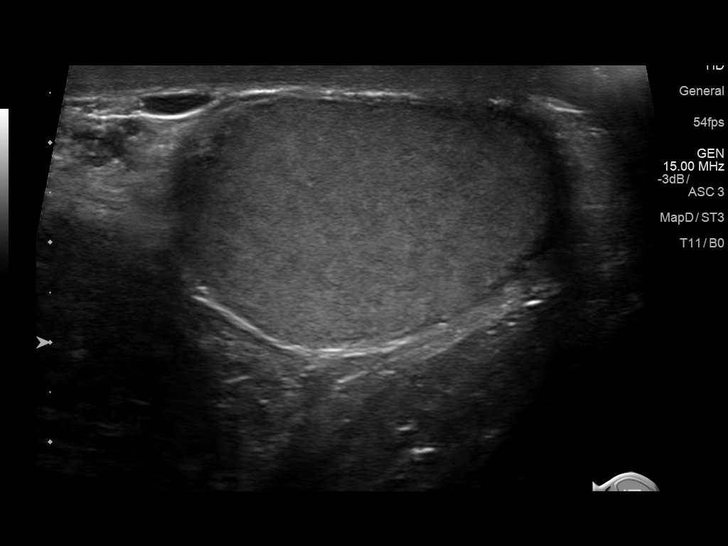
[im 46/55]
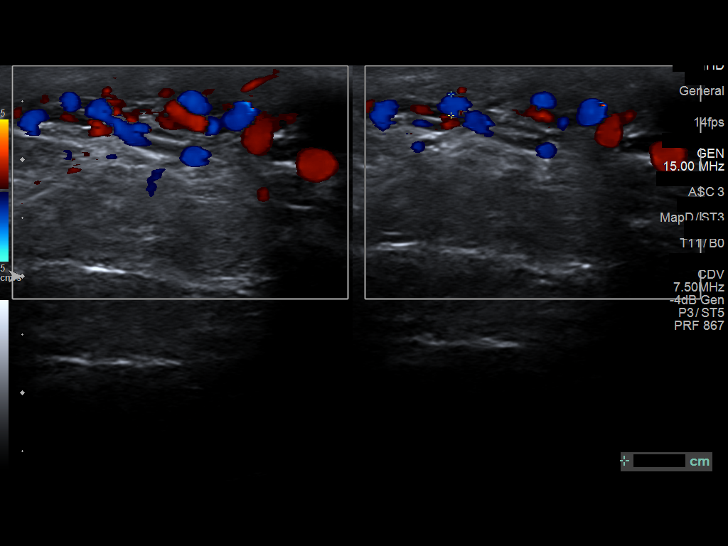
[im 50/55]
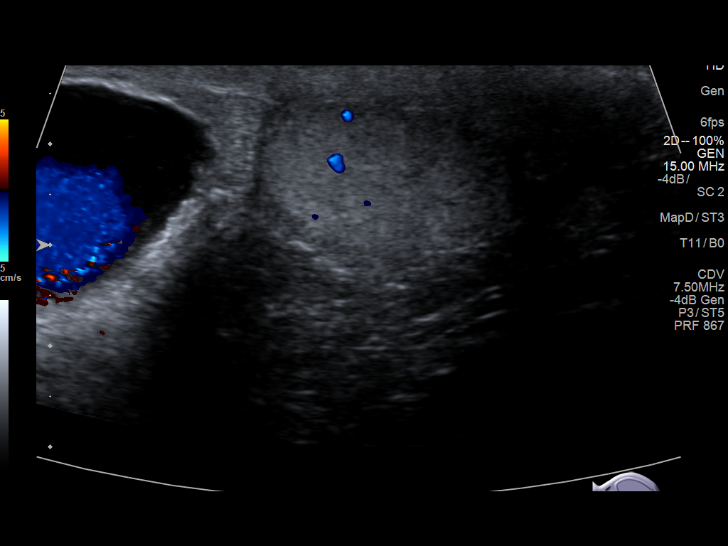
[im 55/55]
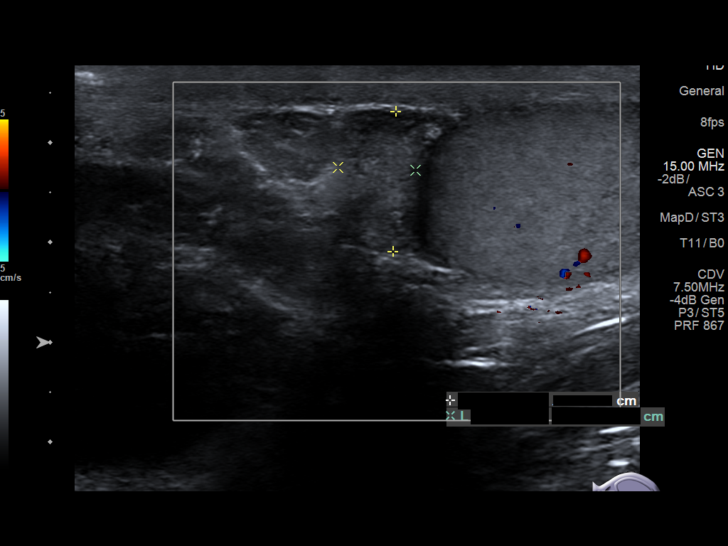

[14 of 25 positions shown; findings below may reference images not displayed]

FINDINGS: Right testicle

Measurements: 4.4 x 2.6 x 3.5 cm. No mass or microlithiasis
visualized.

Left testicle

Measurements: 4.6 x 2.2 x 3.1 cm. No mass or microlithiasis
visualized.

Right epididymis:  Normal in size and appearance.

Left epididymis:  Normal in size and appearance.

Hydrocele:  Moderate-sized right hydrocele.

Varicocele:  None visualized.

Pulsed Doppler interrogation of both testes demonstrates normal low
resistance arterial and venous waveforms bilaterally.
IMPRESSION: 1. No testicular torsion.  No testicular mass.
2. Right hydrocele.

## 2017-02-21 ENCOUNTER — Ambulatory Visit: Payer: 59 | Admitting: Family Medicine

## 2017-02-21 ENCOUNTER — Encounter: Payer: Self-pay | Admitting: Family Medicine

## 2017-02-21 VITALS — BP 136/88 | HR 73 | Ht 74.0 in | Wt 233.8 lb

## 2017-02-21 DIAGNOSIS — I10 Essential (primary) hypertension: Secondary | ICD-10-CM

## 2017-02-21 DIAGNOSIS — Z1159 Encounter for screening for other viral diseases: Secondary | ICD-10-CM

## 2017-02-21 DIAGNOSIS — R739 Hyperglycemia, unspecified: Secondary | ICD-10-CM

## 2017-02-21 DIAGNOSIS — N138 Other obstructive and reflux uropathy: Secondary | ICD-10-CM

## 2017-02-21 DIAGNOSIS — N401 Enlarged prostate with lower urinary tract symptoms: Secondary | ICD-10-CM | POA: Diagnosis not present

## 2017-02-21 DIAGNOSIS — E781 Pure hyperglyceridemia: Secondary | ICD-10-CM

## 2017-02-21 LAB — UA/M W/RFLX CULTURE, ROUTINE
BILIRUBIN UA: NEGATIVE
Glucose, UA: NEGATIVE
KETONES UA: NEGATIVE
LEUKOCYTES UA: NEGATIVE
Nitrite, UA: NEGATIVE
Protein, UA: NEGATIVE
SPEC GRAV UA: 1.01 (ref 1.005–1.030)
Urobilinogen, Ur: 0.2 mg/dL (ref 0.2–1.0)
pH, UA: 5.5 (ref 5.0–7.5)

## 2017-02-21 LAB — MICROSCOPIC EXAMINATION
Bacteria, UA: NONE SEEN
RBC, UA: NONE SEEN /hpf (ref 0–?)

## 2017-02-21 LAB — MICROALBUMIN, URINE WAIVED
Creatinine, Urine Waived: 100 mg/dL (ref 10–300)
Microalb, Ur Waived: 10 mg/L (ref 0–19)
Microalb/Creat Ratio: <30 mg/g (ref <30)

## 2017-02-21 LAB — BAYER DCA HB A1C WAIVED: HB A1C (BAYER DCA - WAIVED): 5.7 % (ref <7.0)

## 2017-02-21 NOTE — Assessment & Plan Note (Signed)
Stable. Checking A1c today. Call with any concerns.

## 2017-02-21 NOTE — Assessment & Plan Note (Signed)
Stable. Checking PSA today. Call with any concerns.

## 2017-02-21 NOTE — Progress Notes (Signed)
BP 136/88 (BP Location: Left Arm, Cuff Size: Normal)   Pulse 73   Ht 6\' 2"  (1.88 m)   Wt 233 lb 12.8 oz (106.1 kg)   SpO2 96%   BMI 30.02 kg/m    Subjective:    Patient ID: Bruce Hester, male    DOB: [birth year], 61 y.o.   MRN: [id number]  HPI: NAVY BELAY is a 61 y.o. male who presents today to establish care.  Chief Complaint  Patient presents with  . Hypertension  . Hyperlipidemia   HYPERTENSION / HYPERLIPIDEMIA Satisfied with current treatment? no Duration of hypertension: chronic BP monitoring frequency: not checking BP medication side effects: no Past BP meds: metoprolol Duration of hyperlipidemia: chronic Cholesterol medication side effects: Only on fish oil Cholesterol supplements: fish oil Past cholesterol medications: none Medication compliance: good compliance Aspirin: yes Recent stressors: no Recurrent headaches: no Visual changes: no Palpitations: no Dyspnea: no Chest pain: no Lower extremity edema: no Dizzy/lightheaded: no  Impaired Fasting Glucose Duration of elevated blood sugar: chronic Polydipsia: no Polyuria: no Weight change: yes Visual disturbance: no Glucose Monitoring: no Diabetic Education: Not Completed Family history of diabetes: no   Active Ambulatory Problems    Diagnosis Date Noted  . Umbilical hernia 62/95/2841  . BPH with obstruction/lower urinary tract symptoms 03/17/2015  . Erectile dysfunction of organic origin 03/17/2015  . Benign fibroma of prostate 05/06/2015  . Blood glucose elevated 12/10/2013  . BP (high blood pressure) 05/06/2015  . Hypertriglyceridemia 10/08/2013  . Fungal infection of nail 05/06/2015   Resolved Ambulatory Problems    Diagnosis Date Noted  . Scrotal swelling 03/17/2015   Past Medical History:  Diagnosis Date  . BPH (benign prostatic hypertrophy)   . Bronchitis   . Cancer ([place])   . Hypertension   . Hypertriglyceridemia   . Pneumonia 2015  . Umbilical hernia    Past Surgical  History:  Procedure Laterality Date  . COLONOSCOPY  2015  . HERNIA REPAIR  3244   umbilical  . INGUINAL HERNIA REPAIR Left 12/20/2015   Procedure: HERNIA REPAIR INGUINAL ADULT;  Surgeon: Robert Bellow, MD;  Location: [facility] ORS;  Service: General;  Laterality: Left;  . SPERMATOCELECTOMY Right 04/19/2015   Procedure: SPERMATOCELECTOMY;  Surgeon: Hollice Espy, MD;  Location: [facility] ORS;  Service: Urology;  Laterality: Right;  LEFT SCROTAL EXPLORATION   Outpatient Encounter Medications as of 02/21/2017  Medication Sig Note  . aspirin EC 81 MG tablet Take 81 mg by mouth daily. Reported on 05/06/2015 03/17/2015: Received from: [place]  . DHA-EPA-VITAMIN E PO Take 1 tablet by mouth daily. Reported on 05/06/2015 03/17/2015: Received from: [place]  . metoprolol succinate (TOPROL-XL) 25 MG 24 hr tablet Take 0.5 tablets by mouth at bedtime.    . Naproxen Sodium (ALEVE PO) Take by mouth as directed.   . Omega-3 Fatty Acids (FISH OIL PO) Take 1 tablet by mouth daily. Reported on 05/06/2015    No facility-administered encounter medications on file as of 02/21/2017.    Allergies  Allergen Reactions  . Ibuprofen Rash    High doses   Social History   Socioeconomic History  . Marital status: Married    Spouse name: Not on file  . Number of children: Not on file  . Years of education: Not on file  . Highest education level: Not on file  Social Needs  . Financial resource strain: Not on file  . Food insecurity - worry: Not on file  .  Food insecurity - inability: Not on file  . Transportation needs - medical: Not on file  . Transportation needs - non-medical: Not on file  Occupational History  . Not on file  Tobacco Use  . Smoking status: Never Smoker  . Smokeless tobacco: Never Used  Substance and Sexual Activity  . Alcohol use: No  . Drug use: No  . Sexual activity: Not on file  Other Topics Concern  . Not on file  Social History Narrative  .  Not on file   Family History  Problem Relation Age of Onset  . Heart disease Father   . Breast cancer Mother   . Kidney disease Neg Hx   . Prostate cancer Neg Hx   . Cancer Neg Hx    Review of Systems  Constitutional: Negative.   Respiratory: Negative.   Cardiovascular: Negative.   Gastrointestinal: Negative.   Genitourinary: Negative.   Musculoskeletal: Negative.   Psychiatric/Behavioral: Negative.     Per HPI unless specifically indicated above     Objective:    BP 136/88 (BP Location: Left Arm, Cuff Size: Normal)   Pulse 73   Ht 6\' 2"  (1.88 m)   Wt 233 lb 12.8 oz (106.1 kg)   SpO2 96%   BMI 30.02 kg/m   Wt Readings from Last 3 Encounters:  02/21/17 233 lb 12.8 oz (106.1 kg)  02/16/16 230 lb (104.3 kg)  12/28/15 235 lb (106.6 kg)    Physical Exam  Constitutional: He is oriented to person, place, and time. He appears well-developed and well-nourished. No distress.  HENT:  Head: Normocephalic and atraumatic.  Right Ear: Hearing normal.  Left Ear: Hearing normal.  Nose: Nose normal.  Eyes: Conjunctivae and lids are normal. Right eye exhibits no discharge. Left eye exhibits no discharge. No scleral icterus.  Cardiovascular: Normal rate, regular rhythm, normal heart sounds and intact distal pulses. Exam reveals no gallop and no friction rub.  No murmur heard. Pulmonary/Chest: Effort normal and breath sounds normal. No respiratory distress. He has no wheezes. He has no rales. He exhibits no tenderness.  Musculoskeletal: Normal range of motion.  Neurological: He is alert and oriented to person, place, and time.  Skin: Skin is warm, dry and intact. No rash noted. He is not diaphoretic. No erythema. No pallor.  Psychiatric: He has a normal mood and affect. His speech is normal and behavior is normal. Judgment and thought content normal. Cognition and memory are normal.  Nursing note and vitals reviewed.   Results for orders placed or performed during the hospital  encounter of 12/20/15  Surgical pathology  Result Value Ref Range   SURGICAL PATHOLOGY      Surgical Pathology CASE: [id number] PATIENT: Joan Flores Surgical Pathology Report     SPECIMEN SUBMITTED: A. Hernia sac, left, inguinal  CLINICAL HISTORY: None provided  PRE-OPERATIVE DIAGNOSIS: Left groin mass  POST-OPERATIVE DIAGNOSIS: Left inguinal hernia     DIAGNOSIS: A. HERNIA SAC, LEFT INGUINAL; REPAIR: - MATURE ADIPOSE TISSUE.   GROSS DESCRIPTION: A. Labeled: Left inguinal hernia sac  Tissue fragment(s): 1  Size: 20 x 14 x 2 cm, weighs 230 g  Description: Lobulated piece of fatty tissue which upon sectioning does not reveal any lesions or nodularities  Representative sections submitted in 2 cassette(s).     Final Diagnosis performed by Delorse Lek, MD.  Electronically signed 12/21/2015 1:24:10PM    The electronic signature indicates that the named Attending Pathologist has evaluated the specimen  Technical component performed at [facility], [place]  [address] [place], [place] [address] Lab: [phone] Dir: Darrick Penna. Evette Doffing, MD   Professional component performed at [facility], [facility], [place], [place], [place] [address] Lab: [phone] Dir: Dellia Nims. Reuel Derby, MD        Assessment & Plan:   Problem List Items Addressed This Visit      Cardiovascular and Mediastinum   BP (high blood pressure) - Primary    Under good control on recheck. Continue current regimen. Continue to monitor. Call with any concerns.       Relevant Orders   CBC with Differential/Platelet   Comprehensive metabolic panel   Microalbumin, Urine Waived   TSH   UA/M w/rflx Culture, Routine     Genitourinary   BPH with obstruction/lower urinary tract symptoms    Stable. Checking PSA today. Call with any concerns.       Relevant Orders   CBC with Differential/Platelet   Comprehensive metabolic panel   PSA   TSH   UA/M w/rflx Culture,  Routine     Other   Blood glucose elevated    Stable. Checking A1c today. Call with any concerns.       Relevant Orders   Bayer DCA Hb A1c Waived   CBC with Differential/Platelet   Comprehensive metabolic panel   Microalbumin, Urine Waived   TSH   UA/M w/rflx Culture, Routine   Hypertriglyceridemia    Not fasting today, but has been under good control. Continue current regimen. Continue to monitor. Call with any concerns.       Relevant Orders   CBC with Differential/Platelet   Comprehensive metabolic panel   Lipid Panel w/o Chol/HDL Ratio   TSH   UA/M w/rflx Culture, Routine    Other Visit Diagnoses    Need for hepatitis C screening test       Labs drawn today. Await results.    Relevant Orders   Hepatitis C Antibody       Follow up plan: Return in about 6 months (around 08/21/2017) for Physical- records release please.

## 2017-02-21 NOTE — Assessment & Plan Note (Signed)
Not fasting today, but has been under good control. Continue current regimen. Continue to monitor. Call with any concerns.

## 2017-02-21 NOTE — Assessment & Plan Note (Signed)
Under good control on recheck. Continue current regimen. Continue to monitor. Call with any concerns.  

## 2017-02-22 LAB — CBC WITH DIFFERENTIAL/PLATELET
BASOS ABS: 0.1 10*3/uL (ref 0.0–0.2)
BASOS: 1 %
EOS (ABSOLUTE): 0.6 10*3/uL — AB (ref 0.0–0.4)
Eos: 7 %
HEMOGLOBIN: 15.4 g/dL (ref 13.0–17.7)
Hematocrit: 43.4 % (ref 37.5–51.0)
Immature Grans (Abs): 0 10*3/uL (ref 0.0–0.1)
Immature Granulocytes: 0 %
LYMPHS ABS: 3.5 10*3/uL — AB (ref 0.7–3.1)
Lymphs: 40 %
MCH: 31.4 pg (ref 26.6–33.0)
MCHC: 35.5 g/dL (ref 31.5–35.7)
MCV: 89 fL (ref 79–97)
MONOCYTES: 9 %
Monocytes Absolute: 0.8 10*3/uL (ref 0.1–0.9)
Neutrophils Absolute: 3.7 10*3/uL (ref 1.4–7.0)
Neutrophils: 43 %
Platelets: 244 10*3/uL (ref 150–379)
RBC: 4.9 x10E6/uL (ref 4.14–5.80)
RDW: 14.1 % (ref 12.3–15.4)
WBC: 8.8 10*3/uL (ref 3.4–10.8)

## 2017-02-22 LAB — COMPREHENSIVE METABOLIC PANEL
ALBUMIN: 4.1 g/dL (ref 3.6–4.8)
ALT: 30 IU/L (ref 0–44)
AST: 23 IU/L (ref 0–40)
Albumin/Globulin Ratio: 1.6 (ref 1.2–2.2)
Alkaline Phosphatase: 104 IU/L (ref 39–117)
BUN/Creatinine Ratio: 14 (ref 10–24)
BUN: 13 mg/dL (ref 8–27)
Bilirubin Total: 0.3 mg/dL (ref 0.0–1.2)
CALCIUM: 9.3 mg/dL (ref 8.6–10.2)
CO2: 25 mmol/L (ref 20–29)
CREATININE: 0.94 mg/dL (ref 0.76–1.27)
Chloride: 104 mmol/L (ref 96–106)
GFR calc Af Amer: 101 mL/min/{1.73_m2} (ref >59)
GFR, EST NON AFRICAN AMERICAN: 88 mL/min/{1.73_m2} (ref >59)
GLOBULIN, TOTAL: 2.6 g/dL (ref 1.5–4.5)
Glucose: 88 mg/dL (ref 65–99)
Potassium: 4.7 mmol/L (ref 3.5–5.2)
SODIUM: 141 mmol/L (ref 134–144)
Total Protein: 6.7 g/dL (ref 6.0–8.5)

## 2017-02-22 LAB — LIPID PANEL W/O CHOL/HDL RATIO
Cholesterol, Total: 189 mg/dL (ref 100–199)
HDL: 25 mg/dL — ABNORMAL LOW (ref >39)
TRIGLYCERIDES: 506 mg/dL — AB (ref 0–149)

## 2017-02-22 LAB — PSA: PROSTATE SPECIFIC AG, SERUM: 0.2 ng/mL (ref 0.0–4.0)

## 2017-02-22 LAB — TSH: TSH: 3.33 u[IU]/mL (ref 0.450–4.500)

## 2017-02-22 LAB — HEPATITIS C ANTIBODY: Hep C Virus Ab: <0.1 s/co ratio (ref 0.0–0.9)

## 2017-04-14 IMAGING — US US ART/VEN ABD/PELV/SCROTUM DOPPLER LTD
1 series · 13 of 25 positions shown · non-contrast
Comparison: Testicular ultrasound March 21, 2015

CLINICAL DATA: History of left-sided scrotal swelling. Diagnosis
with a right-sided hydrocele in March 2015 with surgical repair
in April 2015. The patient reports left-sided scrotal swelling
since this surgery.

EXAM:
SCROTAL ULTRASOUND
DOPPLER ULTRASOUND OF THE TESTICLES
TECHNIQUE: Complete ultrasound examination of the testicles, epididymis, and
other scrotal structures was performed. Color and spectral Doppler
ultrasound were also utilized to evaluate blood flow to the
testicles.

[Series 1: us art/ven abd/pelv/scrotum doppler ltd · 0.07mm/px · 13 of 55 slices shown]
[im 1/55]
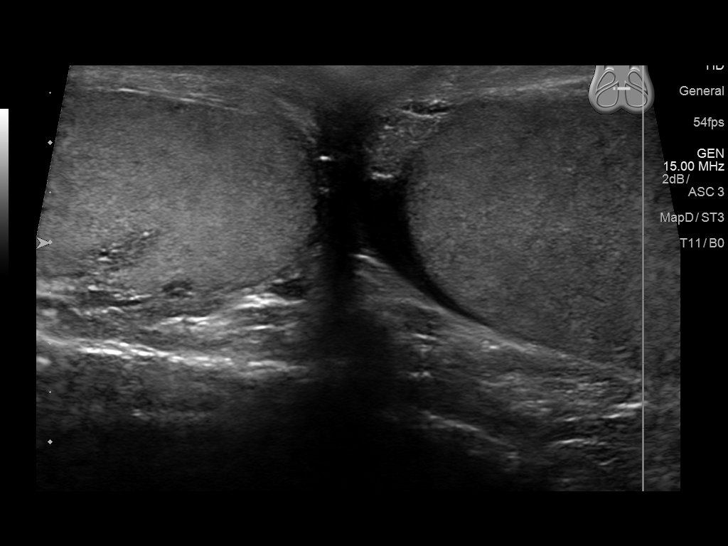
[im 5/55]
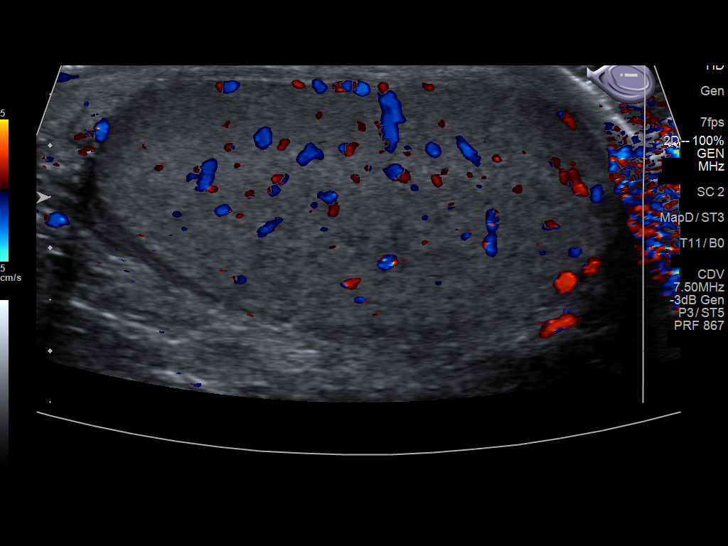
[im 10/55]
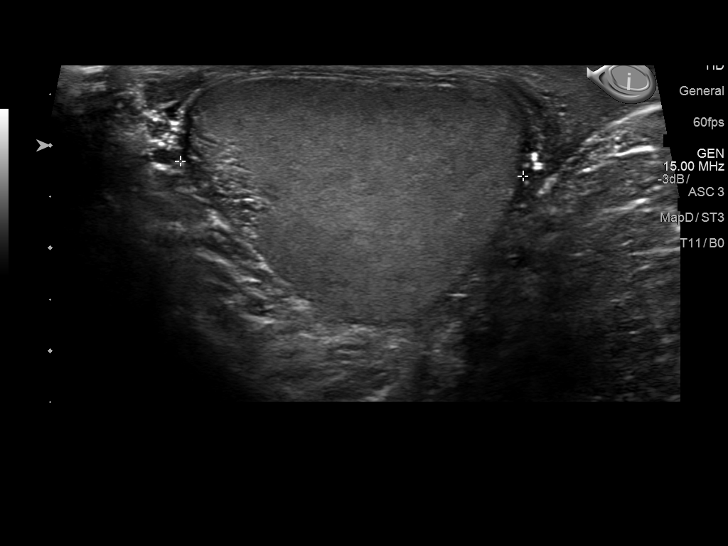
[im 14/55]
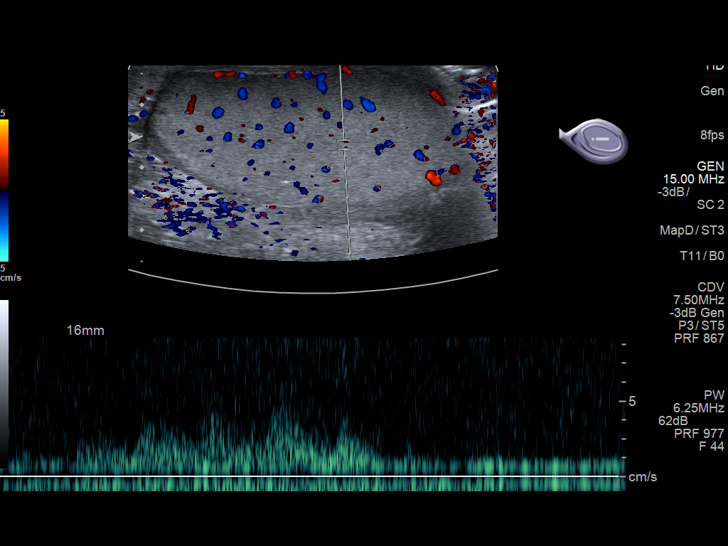
[im 19/55]
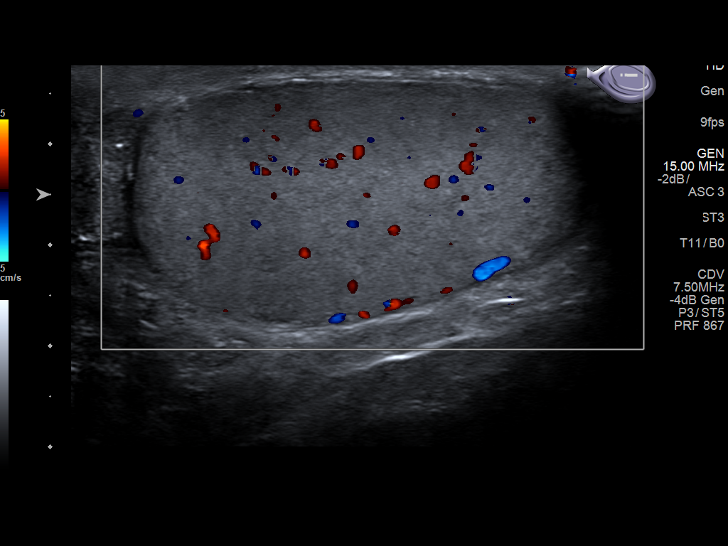
[im 23/55]
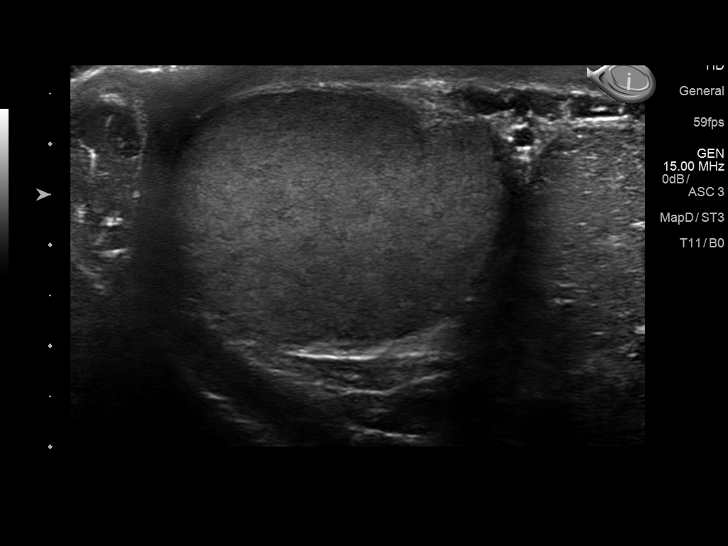
[im 28/55]
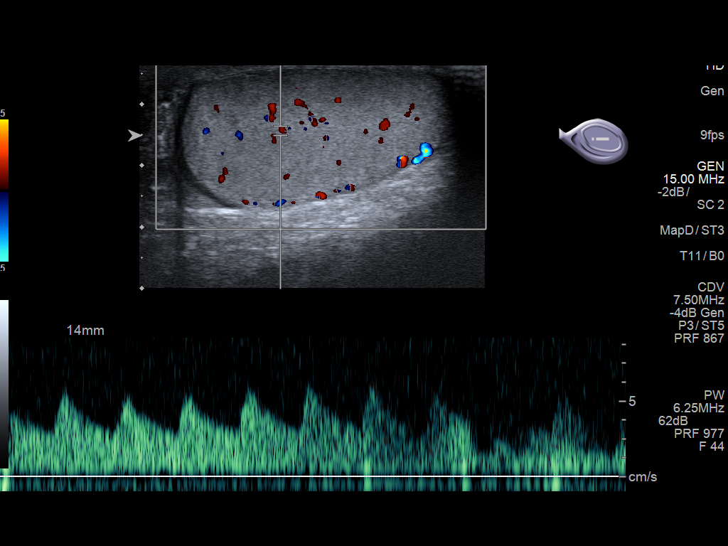
[im 32/55]
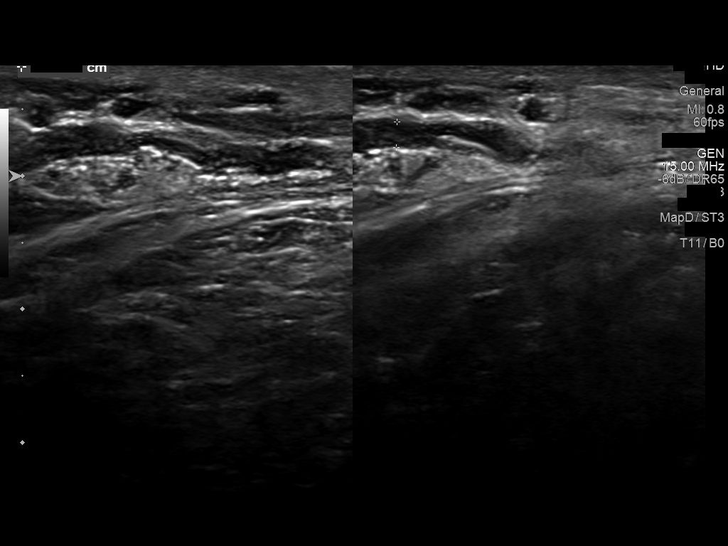
[im 37/55]
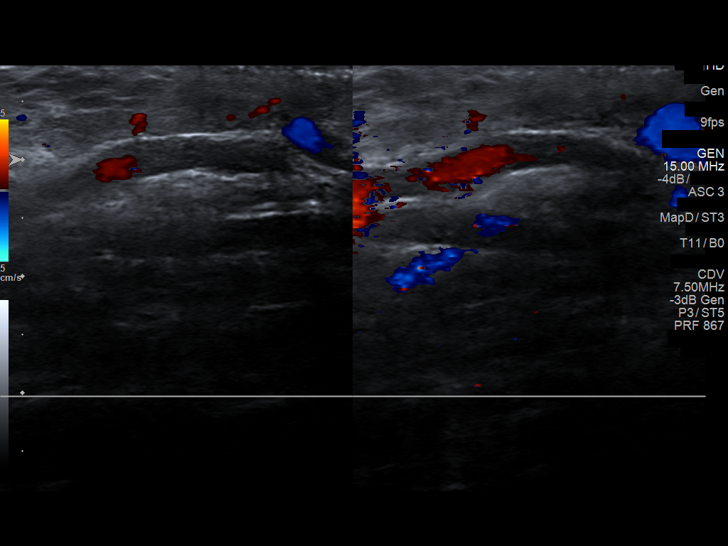
[im 41/55]
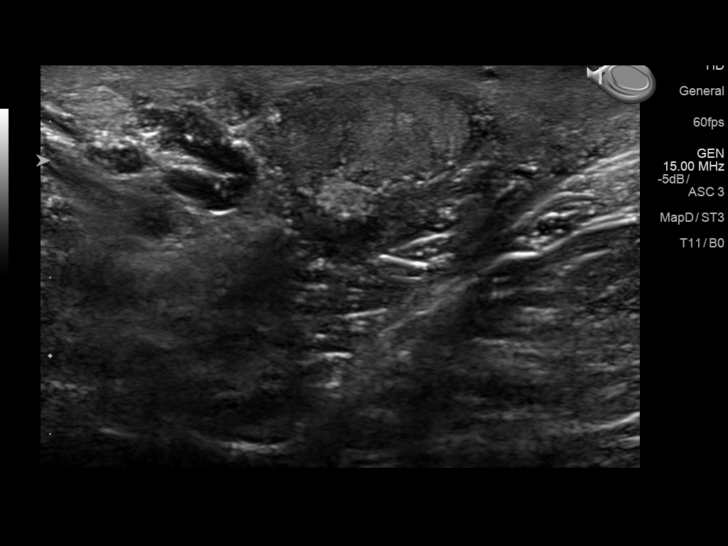
[im 46/55]
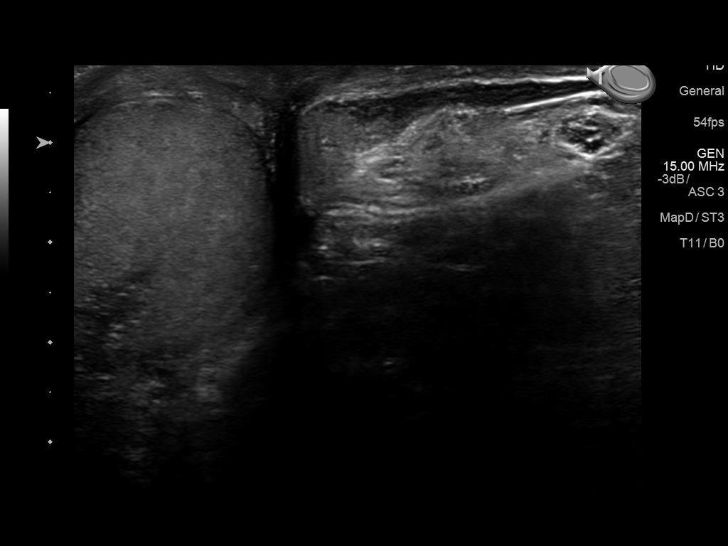
[im 50/55]
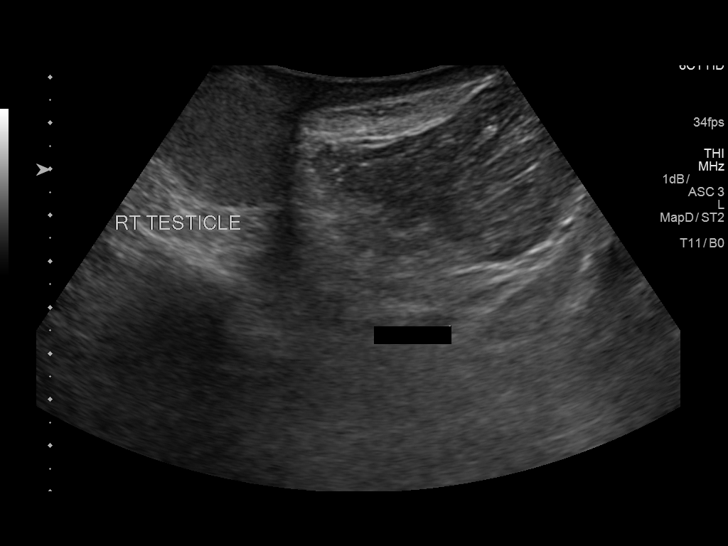
[im 55/55]
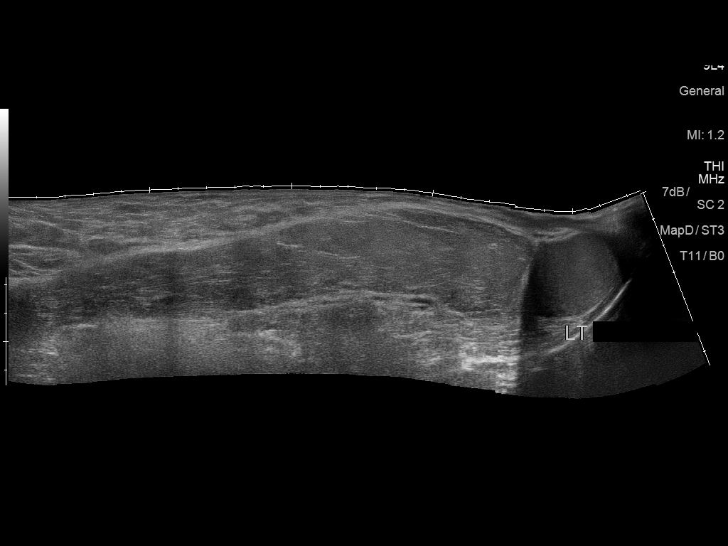

[13 of 25 positions shown; findings below may reference images not displayed]

FINDINGS: Right testicle

Measurements: 5.3 x 2.6 x 3.3 cm. No mass or microlithiasis
visualized.

Left testicle

Measurements: 4.5 x 2.3 x 3.3 cm. No mass or microlithiasis
visualized.

There is abnormal soft tissue extending into the left aspect of the
hernia sac from the groin consistent with fat. No peristalsing bowel
is observed

Right epididymis:  Normal in size and appearance.

Left epididymis:  Normal in size and appearance.

Hydrocele:  No hydrocele is observed.

Varicocele:  There is a small left varicocele.

Pulsed Doppler interrogation of both testes demonstrates normal low
resistance arterial and venous waveforms bilaterally.
IMPRESSION: 1. Left inguinal hernia containing fat but no definite bowel
observed within the hernia.
2. Normal appearance of the testes and epididymal structures.
Vascularity is normal.
3. No recurrent hydrocele.  Small left varicocele.

## 2017-08-26 ENCOUNTER — Encounter: Payer: Self-pay | Admitting: Family Medicine

## 2017-11-14 IMAGING — CR DG CHEST 2V
2 series · 2 of 2 positions shown · non-contrast
Comparison: 05/27/2013 and prior exams

CLINICAL DATA: 59-year-old male with cough and wheezing for 2
weeks.

EXAM:
CHEST  2 VIEW

[chest pa]
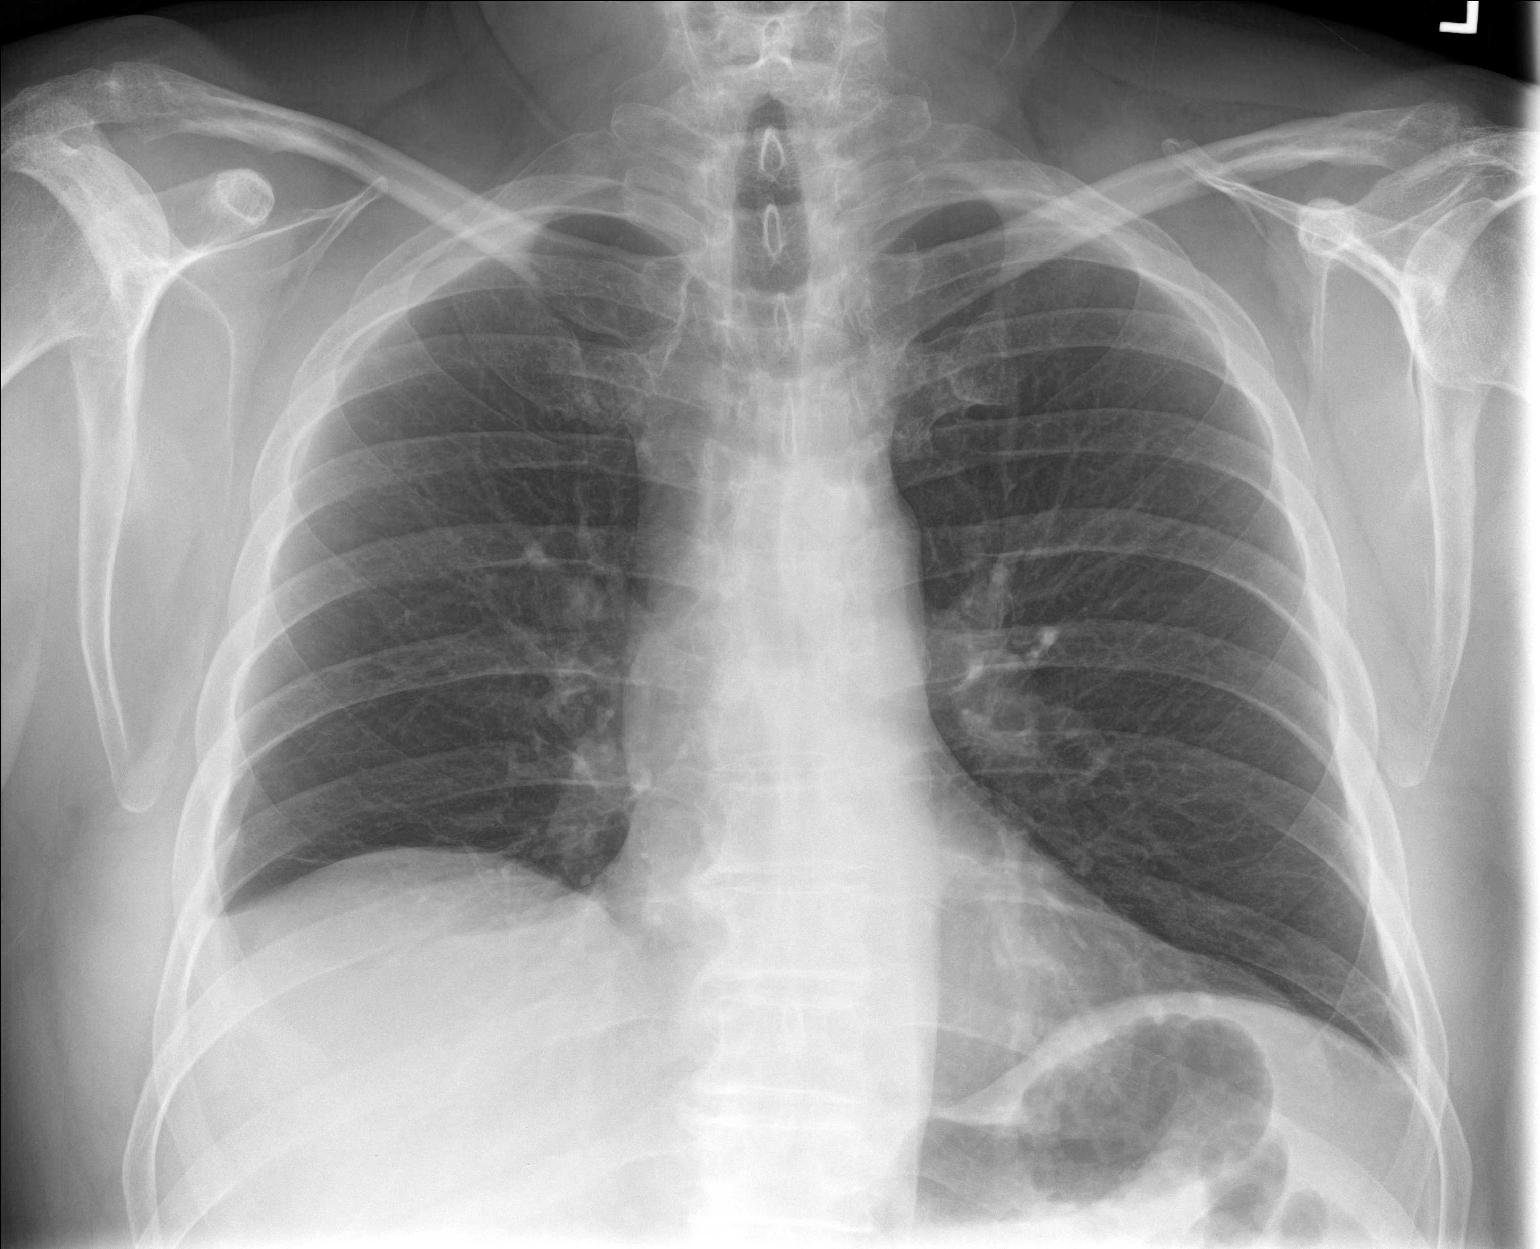

[chest lat]
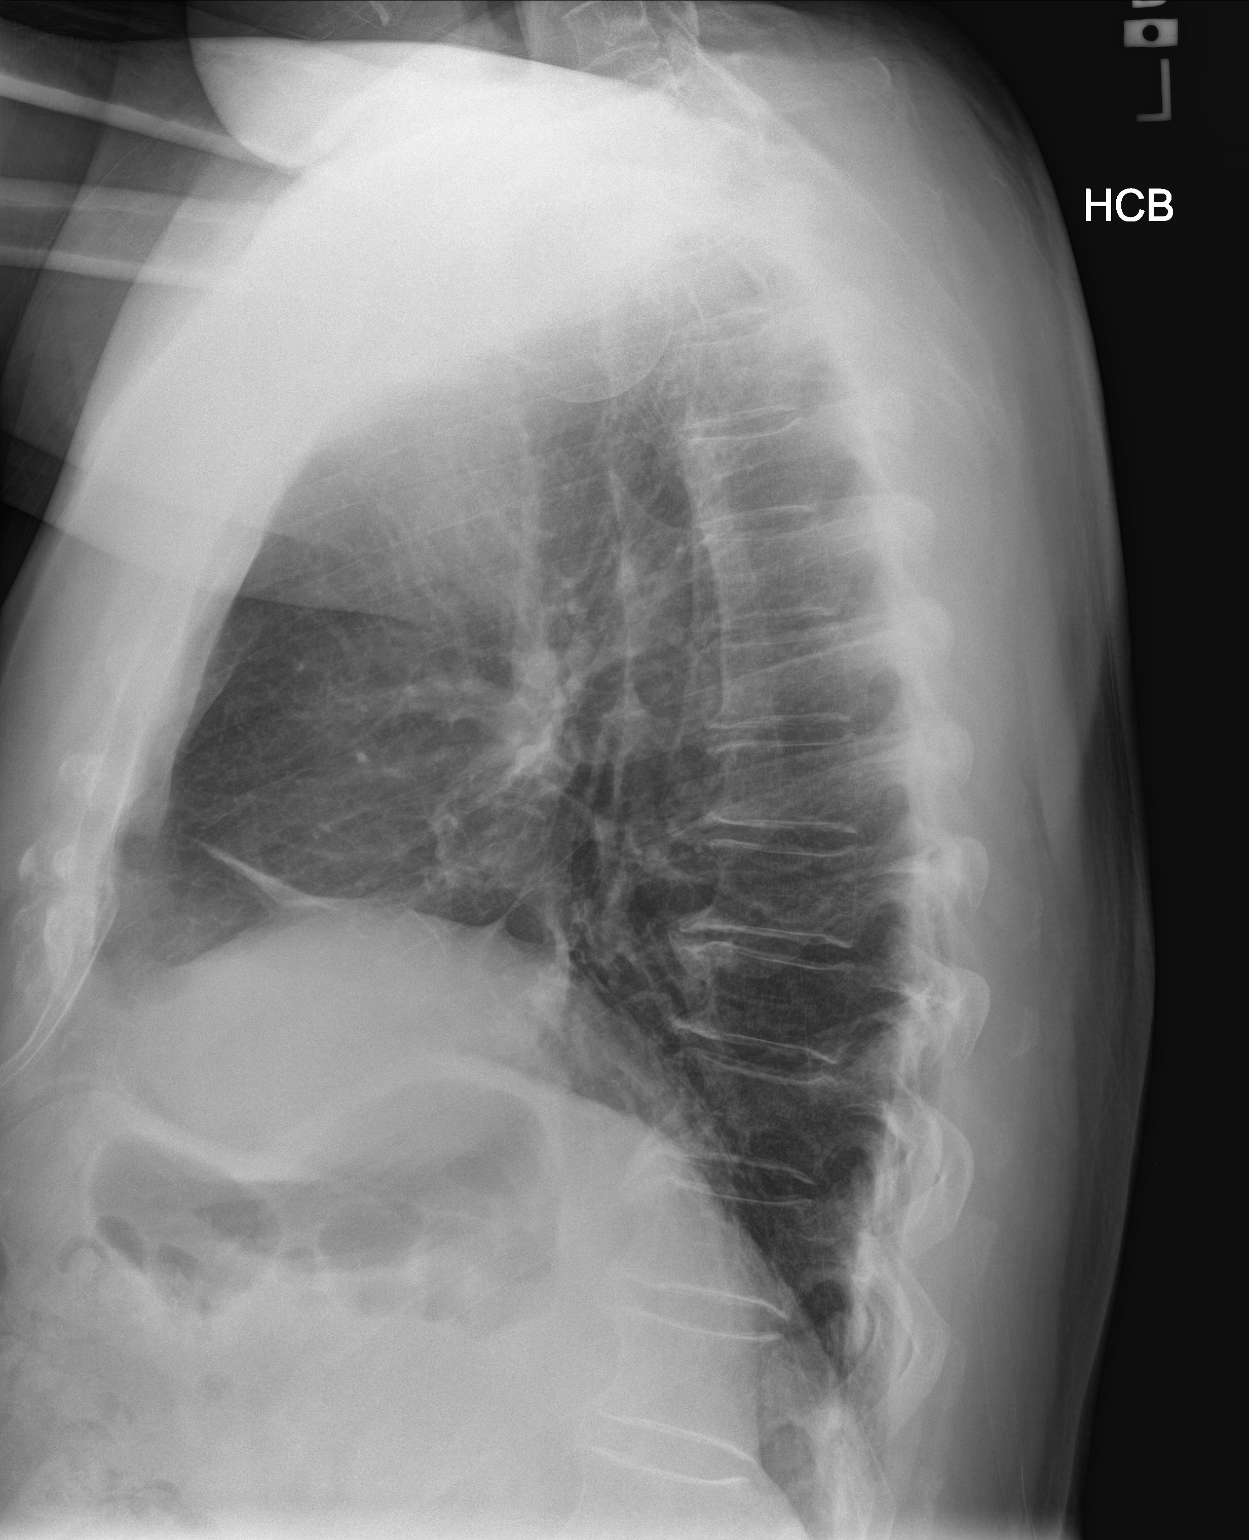

[2 of 2 positions shown; findings below may reference images not displayed]

FINDINGS: The cardiomediastinal silhouette is unremarkable.

Elevation of the right hemidiaphragm is again noted with right
basilar scarring.

There is no evidence of focal airspace disease, pulmonary edema,
suspicious pulmonary nodule/mass, pleural effusion, or pneumothorax.
No acute bony abnormalities are identified.
IMPRESSION: No active cardiopulmonary disease.

## 2019-06-19 ENCOUNTER — Ambulatory Visit: Payer: Managed Care, Other (non HMO) | Attending: Internal Medicine

## 2019-06-19 DIAGNOSIS — Z23 Encounter for immunization: Secondary | ICD-10-CM

## 2019-06-19 NOTE — Progress Notes (Signed)
   Covid-19 Vaccination Clinic  Name:  Bruce Hester    MRN: [id number] DOB: 1955/08/19  06/19/2019  [name] was observed post Covid-19 immunization for 15 minutes without incident. He was provided with Vaccine Information Sheet and instruction to access the V-Safe system.   [name] was instructed to call 911 with any severe reactions post vaccine: Marland Kitchen Difficulty breathing  . Swelling of face and throat  . A fast heartbeat  . A bad rash all over body  . Dizziness and weakness   Immunizations Administered    Name Date Dose VIS Date Route   Moderna COVID-19 Vaccine 06/19/2019  3:07 AM 0.5 mL 03/10/2019 Intramuscular   Manufacturer: Moderna   Lot: [id number]   [id number]

## 2019-07-21 ENCOUNTER — Ambulatory Visit: Payer: Managed Care, Other (non HMO) | Attending: Internal Medicine

## 2019-07-21 DIAGNOSIS — Z23 Encounter for immunization: Secondary | ICD-10-CM

## 2019-07-21 NOTE — Progress Notes (Signed)
   Covid-19 Vaccination Clinic  Name:  Bruce Hester    MRN: [id number] DOB: 1955/09/25  07/21/2019  [name] was observed post Covid-19 immunization for 15 minutes without incident. He was provided with Vaccine Information Sheet and instruction to access the V-Safe system.   [name] was instructed to call 911 with any severe reactions post vaccine: Marland Kitchen Difficulty breathing  . Swelling of face and throat  . A fast heartbeat  . A bad rash all over body  . Dizziness and weakness   Immunizations Administered    Name Date Dose VIS Date Route   Moderna COVID-19 Vaccine 07/21/2019  1:52 PM 0.5 mL 03/10/2019 Intramuscular   Manufacturer: Moderna   Lot: [id number]   [place][id number]
